# Patient Record
Sex: Female | Born: 1945
Health system: Southern US, Community
[De-identification: ages and names within clinical notes are randomized; demographics above are authoritative.]

## PROBLEM LIST (undated history)

## (undated) DIAGNOSIS — Z8719 Personal history of other diseases of the digestive system: Secondary | ICD-10-CM

## (undated) DIAGNOSIS — K219 Gastro-esophageal reflux disease without esophagitis: Secondary | ICD-10-CM

## (undated) DIAGNOSIS — M1990 Unspecified osteoarthritis, unspecified site: Secondary | ICD-10-CM

## (undated) DIAGNOSIS — R112 Nausea with vomiting, unspecified: Secondary | ICD-10-CM

## (undated) DIAGNOSIS — T8859XA Other complications of anesthesia, initial encounter: Secondary | ICD-10-CM

## (undated) DIAGNOSIS — T4145XA Adverse effect of unspecified anesthetic, initial encounter: Secondary | ICD-10-CM

## (undated) DIAGNOSIS — N189 Chronic kidney disease, unspecified: Secondary | ICD-10-CM

## (undated) DIAGNOSIS — G709 Myoneural disorder, unspecified: Secondary | ICD-10-CM

## (undated) DIAGNOSIS — Z9889 Other specified postprocedural states: Secondary | ICD-10-CM

## (undated) DIAGNOSIS — Z8679 Personal history of other diseases of the circulatory system: Secondary | ICD-10-CM

## (undated) HISTORY — PX: CHOLECYSTECTOMY: SHX55

## (undated) HISTORY — DX: Personal history of other diseases of the digestive system: Z87.19

## (undated) HISTORY — DX: Personal history of other diseases of the circulatory system: Z86.79

## (undated) HISTORY — PX: TONSILLECTOMY: SUR1361

---

## 1999-03-04 ENCOUNTER — Other Ambulatory Visit: Admission: RE | Admit: 1999-03-04 | Discharge: 1999-03-04 | Payer: Self-pay | Admitting: Obstetrics & Gynecology

## 2000-12-15 HISTORY — PX: EYE SURGERY: SHX253

## 2001-02-05 ENCOUNTER — Other Ambulatory Visit: Admission: RE | Admit: 2001-02-05 | Discharge: 2001-02-05 | Payer: Self-pay | Admitting: Obstetrics & Gynecology

## 2002-07-18 ENCOUNTER — Other Ambulatory Visit: Admission: RE | Admit: 2002-07-18 | Discharge: 2002-07-18 | Payer: Self-pay | Admitting: Obstetrics & Gynecology

## 2003-10-20 ENCOUNTER — Other Ambulatory Visit: Admission: RE | Admit: 2003-10-20 | Discharge: 2003-10-20 | Payer: Self-pay | Admitting: Gynecology

## 2004-11-18 ENCOUNTER — Other Ambulatory Visit: Admission: RE | Admit: 2004-11-18 | Discharge: 2004-11-18 | Payer: Self-pay | Admitting: Obstetrics & Gynecology

## 2005-10-16 ENCOUNTER — Ambulatory Visit: Payer: Self-pay | Admitting: Cardiology

## 2005-10-22 ENCOUNTER — Ambulatory Visit: Payer: Self-pay | Admitting: Cardiology

## 2005-10-24 ENCOUNTER — Ambulatory Visit: Payer: Self-pay | Admitting: Cardiology

## 2005-10-24 ENCOUNTER — Ambulatory Visit (HOSPITAL_COMMUNITY): Admission: RE | Admit: 2005-10-24 | Discharge: 2005-10-24 | Payer: Self-pay | Admitting: Cardiology

## 2005-11-12 ENCOUNTER — Ambulatory Visit: Payer: Self-pay | Admitting: Cardiology

## 2005-11-20 ENCOUNTER — Ambulatory Visit: Payer: Self-pay | Admitting: Cardiology

## 2005-11-21 ENCOUNTER — Other Ambulatory Visit: Admission: RE | Admit: 2005-11-21 | Discharge: 2005-11-21 | Payer: Self-pay | Admitting: Obstetrics & Gynecology

## 2014-12-12 ENCOUNTER — Other Ambulatory Visit (HOSPITAL_COMMUNITY): Payer: Self-pay | Admitting: Urology

## 2014-12-12 DIAGNOSIS — Q6211 Congenital occlusion of ureteropelvic junction: Principal | ICD-10-CM

## 2014-12-12 DIAGNOSIS — Q6239 Other obstructive defects of renal pelvis and ureter: Secondary | ICD-10-CM

## 2014-12-21 ENCOUNTER — Ambulatory Visit (HOSPITAL_COMMUNITY): Payer: Self-pay

## 2015-03-22 ENCOUNTER — Encounter (INDEPENDENT_AMBULATORY_CARE_PROVIDER_SITE_OTHER): Payer: Self-pay | Admitting: *Deleted

## 2015-03-28 ENCOUNTER — Encounter (INDEPENDENT_AMBULATORY_CARE_PROVIDER_SITE_OTHER): Payer: Self-pay | Admitting: *Deleted

## 2015-03-28 ENCOUNTER — Other Ambulatory Visit (INDEPENDENT_AMBULATORY_CARE_PROVIDER_SITE_OTHER): Payer: Self-pay | Admitting: *Deleted

## 2015-03-28 DIAGNOSIS — Z800 Family history of malignant neoplasm of digestive organs: Secondary | ICD-10-CM

## 2015-03-28 DIAGNOSIS — Z860100 Personal history of colon polyps, unspecified: Secondary | ICD-10-CM

## 2015-04-05 ENCOUNTER — Encounter (INDEPENDENT_AMBULATORY_CARE_PROVIDER_SITE_OTHER): Payer: Self-pay

## 2015-05-17 ENCOUNTER — Telehealth (INDEPENDENT_AMBULATORY_CARE_PROVIDER_SITE_OTHER): Payer: Self-pay | Admitting: *Deleted

## 2015-05-17 NOTE — Telephone Encounter (Signed)
Patient needs trilyte 

## 2015-05-18 MED ORDER — PEG 3350-KCL-NA BICARB-NACL 420 G PO SOLR: 4000.0000 mL | Freq: Once | ORAL | Status: DC

## 2015-06-07 ENCOUNTER — Telehealth (INDEPENDENT_AMBULATORY_CARE_PROVIDER_SITE_OTHER): Payer: Self-pay | Admitting: *Deleted

## 2015-06-07 NOTE — Telephone Encounter (Signed)
agree

## 2015-06-07 NOTE — Telephone Encounter (Signed)
Referring MD/PCP: vyas   Procedure: tcs  Reason/Indication:  Hx polyps, fam hx colon ca  Has patient had this procedure before?  Yes, 2011 -- scanned  If so, when, by whom and where?    Is there a family history of colon cancer?  Yes, paternal uncle & cousin  Who?  What age when diagnosed?    Is patient diabetic?   no      Does patient have prosthetic heart valve?  no  Do you have a pacemaker?  no  Has patient ever had endocarditis? no  Has patient had joint replacement within last 12 months?  no  Does patient tend to be constipated or take laxatives? no  Is patient on Coumadin, Plavix and/or Aspirin? no  Medications: allegra 180 mg daily, prilosec otc 20 mg daily, probiotic daily, vit d weekly, vit c daily, calcium 600 mg daily  Allergies: cipro, levaquin, prednisone, vesicare, allegra d  Medication Adjustment:   Procedure date & time: 06/28/15 at 1100

## 2015-06-28 ENCOUNTER — Ambulatory Visit (HOSPITAL_COMMUNITY)
Admission: RE | Admit: 2015-06-28 | Discharge: 2015-06-28 | Disposition: A | Discharge status: Home / Self Care | Payer: Medicare Other | Source: Ambulatory Visit | Attending: Internal Medicine | Admitting: Internal Medicine

## 2015-06-28 ENCOUNTER — Encounter (HOSPITAL_COMMUNITY): Payer: Self-pay | Admitting: *Deleted

## 2015-06-28 ENCOUNTER — Encounter (HOSPITAL_COMMUNITY)
Admission: RE | Disposition: A | Discharge status: Home / Self Care | Payer: Self-pay | Source: Ambulatory Visit | Attending: Internal Medicine

## 2015-06-28 DIAGNOSIS — K648 Other hemorrhoids: Secondary | ICD-10-CM

## 2015-06-28 DIAGNOSIS — Z1211 Encounter for screening for malignant neoplasm of colon: Secondary | ICD-10-CM | POA: Insufficient documentation

## 2015-06-28 DIAGNOSIS — M4690 Unspecified inflammatory spondylopathy, site unspecified: Secondary | ICD-10-CM | POA: Diagnosis not present

## 2015-06-28 DIAGNOSIS — Z86010 Personal history of colonic polyps: Secondary | ICD-10-CM | POA: Insufficient documentation

## 2015-06-28 DIAGNOSIS — K5730 Diverticulosis of large intestine without perforation or abscess without bleeding: Secondary | ICD-10-CM

## 2015-06-28 DIAGNOSIS — K219 Gastro-esophageal reflux disease without esophagitis: Secondary | ICD-10-CM | POA: Insufficient documentation

## 2015-06-28 DIAGNOSIS — Z79899 Other long term (current) drug therapy: Secondary | ICD-10-CM | POA: Diagnosis not present

## 2015-06-28 DIAGNOSIS — M13849 Other specified arthritis, unspecified hand: Secondary | ICD-10-CM | POA: Insufficient documentation

## 2015-06-28 DIAGNOSIS — Z9049 Acquired absence of other specified parts of digestive tract: Secondary | ICD-10-CM | POA: Diagnosis not present

## 2015-06-28 DIAGNOSIS — Z800 Family history of malignant neoplasm of digestive organs: Secondary | ICD-10-CM

## 2015-06-28 DIAGNOSIS — K644 Residual hemorrhoidal skin tags: Secondary | ICD-10-CM | POA: Diagnosis not present

## 2015-06-28 DIAGNOSIS — F17210 Nicotine dependence, cigarettes, uncomplicated: Secondary | ICD-10-CM | POA: Diagnosis not present

## 2015-06-28 DIAGNOSIS — N189 Chronic kidney disease, unspecified: Secondary | ICD-10-CM | POA: Insufficient documentation

## 2015-06-28 DIAGNOSIS — M1388 Other specified arthritis, other site: Secondary | ICD-10-CM | POA: Diagnosis not present

## 2015-06-28 HISTORY — DX: Nausea with vomiting, unspecified: R11.2

## 2015-06-28 HISTORY — DX: Chronic kidney disease, unspecified: N18.9

## 2015-06-28 HISTORY — DX: Gastro-esophageal reflux disease without esophagitis: K21.9

## 2015-06-28 HISTORY — DX: Unspecified osteoarthritis, unspecified site: M19.90

## 2015-06-28 HISTORY — DX: Myoneural disorder, unspecified: G70.9

## 2015-06-28 HISTORY — DX: Other complications of anesthesia, initial encounter: T88.59XA

## 2015-06-28 HISTORY — PX: COLONOSCOPY: SHX5424

## 2015-06-28 HISTORY — DX: Adverse effect of unspecified anesthetic, initial encounter: T41.45XA

## 2015-06-28 HISTORY — DX: Personal history of other diseases of the digestive system: Z87.19

## 2015-06-28 HISTORY — DX: Other specified postprocedural states: Z98.890

## 2015-06-28 SURGERY — COLONOSCOPY
Anesthesia: Moderate Sedation

## 2015-06-28 MED ORDER — DICYCLOMINE HCL 10 MG PO CAPS: 10.0000 mg | ORAL_CAPSULE | Freq: Three times a day (TID) | ORAL | Status: DC

## 2015-06-28 MED ORDER — MIDAZOLAM HCL 5 MG/5ML IJ SOLN
INTRAMUSCULAR | Status: DC | PRN
  Administered 2015-06-28 (×2): 2 mg via INTRAVENOUS
  Administered 2015-06-28 (×2): 1 mg via INTRAVENOUS

## 2015-06-28 MED ORDER — MEPERIDINE HCL 50 MG/ML IJ SOLN
INTRAMUSCULAR | Status: DC | PRN
  Administered 2015-06-28 (×2): 25 mg via INTRAVENOUS

## 2015-06-28 MED ORDER — SODIUM CHLORIDE 0.9 % IV SOLN
INTRAVENOUS | Status: DC
  Administered 2015-06-28: 1000 mL via INTRAVENOUS

## 2015-06-28 MED ORDER — STERILE WATER FOR IRRIGATION IR SOLN
Status: DC | PRN
  Administered 2015-06-28: 11:00:00

## 2015-06-28 MED ORDER — MEPERIDINE HCL 50 MG/ML IJ SOLN
INTRAMUSCULAR | Status: AC
  Filled 2015-06-28: qty 1

## 2015-06-28 MED ORDER — MIDAZOLAM HCL 5 MG/5ML IJ SOLN
INTRAMUSCULAR | Status: AC
  Filled 2015-06-28: qty 10

## 2015-06-28 NOTE — Op Note (Signed)
COLONOSCOPY PROCEDURE REPORT  PATIENT:  Suzanne Combs  MR#:  [id number] Birthdate:  02-19-1946, 69 y.o., female Endoscopist:  Dr. Malissa HippoNajeeb U. Nollan Muldrow, MD Referred By:  Dr. Ignatius Speckinghruv B. Vyas, MD Procedure Date: 06/28/2015  Procedure:   Colonoscopy  Indications:  Patient is 69 year old Caucasian female was undergoing high-risk screening colonoscopy. Family history significant for CRC and first cousin was in his 30s of the time of diagnosis and oto-metastatic disease. Paternal uncle also had colon carcinoma in early 2370s. She has chronic diarrhea felt to be due to IBS. Last colonoscopy was in 2011 with removal of 3 small polyps but these were hyperplastic.  Informed Consent:  The procedure and risks were reviewed with the patient and informed consent was obtained.  Medications:  Demerol 50 mg IV Versed 6 mg IV  Description of procedure:  After a digital rectal exam was performed, that colonoscope was advanced from the anus through the rectum and colon to the area of the cecum, ileocecal valve and appendiceal orifice. The cecum was deeply intubated. These structures were well-seen and photographed for the record. From the level of the cecum and ileocecal valve, the scope was slowly and cautiously withdrawn. The mucosal surfaces were carefully surveyed utilizing scope tip to flexion to facilitate fold flattening as needed. The scope was pulled down into the rectum where a thorough exam including retroflexion was performed. Terminal ileum was also examined.  Findings:   Prep excellent. Normal mucosa of terminal ileum. Normal mucosa of colon throughout. Scattered diverticula at descending and sigmoid colon. Normal rectal mucosa. Small hemorrhoids below the dentate line.   Therapeutic/Diagnostic Maneuvers Performed:  None  Complications:  none  Cecal Withdrawal Time:  8 minutes  Impression:  Normal mucosa of terminal ileum. Mild left-sided colonic diverticulosis. No evidence of colonic  polyps are endoscopic colitis. Small external hemorrhoids.  Recommendations:  Standard instructions given. Dicyclomine 10 mg by mouth before meals. Patient advised to keep stool diary until office visit in 6 weeks. Next colonoscopy in 5 years.  Briston Lax U  06/28/2015 12:09 PM  CC: Dr. Ignatius SpeckingVYAS,DHRUV B., MD & Dr. Bonnetta BarryNo ref. provider found

## 2015-06-28 NOTE — H&P (Signed)
Suzanne Combs is an 69 y.o. female.   Chief Complaint:    Patient is here for colonoscopy. HPI:  She 69 year old Caucasian female who is undergoing colonoscopy for screening purposes. Family history significant for CRC and first cousin was in his 30s at the time of diagnosis and died of metastatic disease. She also has a paternal uncle history of colon carcinoma in early 66s.  last colonoscopy was in December 2011 with removal of 3 small polyps but they were not adenomatous.  Patient gives history of chronic diarrhea. She denies rectal bleeding or weight loss. She has occasional nocturnal bowel movement. She did have sigmoid colon biopsy in her last examined was normal.  Past Medical History  Diagnosis Date  . Complication of anesthesia   . PONV (postoperative nausea and vomiting)   . Chronic kidney disease     Left ureter obstruction  . GERD (gastroesophageal reflux disease)   . History of hiatal hernia   . Neuromuscular disorder     bulging disc in neck  . Arthritis     back, neck, and fingers    Past Surgical History  Procedure Laterality Date  . Cholecystectomy    . Tonsillectomy    . Eye surgery Bilateral 2002    cataract    Family History  Problem Relation Age of Onset  . Stroke Mother   . Hypertension Mother   . Dementia Mother   . Arthritis Mother   . Hypertension Father   . Prostate cancer Father   . Arthritis Father   . Hypertension Sister   . Glaucoma Sister   . Diverticulosis Sister   . Hypertension Brother   . Colon cancer Paternal Uncle 35  . Colon cancer Cousin 1   Social History:  reports that she has been smoking Cigarettes.  She has a 60 pack-year smoking history. She does not have any smokeless tobacco history on file. She reports that she drinks alcohol. She reports that she does not use illicit drugs.  Allergies:  Allergies  Allergen Reactions  . Prednisone Other (See Comments)    Can not tablets(Pain,headache)  . Quinolones Other (See  Comments)    Affects joints  . Vesicare [Solifenacin]     Increased BP and heart rate    Medications Prior to Admission  Medication Sig Dispense Refill  . omeprazole (PRILOSEC OTC) 20 MG tablet Take 20 mg by mouth daily.    . Probiotic Product (PROBIOTIC DAILY PO) Take 1 capsule by mouth daily.    . vitamin C (ASCORBIC ACID) 500 MG tablet Take 500 mg by mouth daily.    . Vitamin D, Ergocalciferol, (DRISDOL) 50000 UNITS CAPS capsule Take 50,000 Units by mouth every 7 (seven) days.    . polyethylene glycol-electrolytes (NULYTELY/GOLYTELY) 420 G solution Take 4,000 mLs by mouth once. 4000 mL 0    No results found for this or any previous visit (from the past 48 hour(s)). No results found.  ROS  Blood pressure 134/81, pulse 73, temperature 98.7 F (37.1 C), temperature source Oral, resp. rate 16, height  (1.651 m), weight 165 lb (74.844 kg), SpO2 99 %. Physical Exam  Constitutional: She appears well-developed and well-nourished.  HENT:  Mouth/Throat: Oropharynx is clear and moist.  Eyes: Conjunctivae are normal. No scleral icterus.  Neck: No thyromegaly present.  Cardiovascular: Normal rate, regular rhythm and normal heart sounds.   No murmur heard. Respiratory: Effort normal and breath sounds normal.  GI: Soft. She exhibits no distension. There is tenderness (  mild generalized tenderness.).  Musculoskeletal: She exhibits no edema.  Lymphadenopathy:    She has no cervical adenopathy.  Neurological: She is alert.  Skin: Skin is warm and dry.     Assessment/Plan  High risk screening colonoscopy.  Chronic diarrhea felt to be IBS.  Bricen Victory U 06/28/2015, 11:32 AM

## 2015-06-28 NOTE — Discharge Instructions (Signed)
Resume usual medications and high fiber diet. Dicyclomine 10 mg by mouth 30 minutes before each meal. No driving for 24 hours. Keep stool diary as to frequency and consistency of stools until office visit in 6 weeks.  Colonoscopy, Care After Refer to this sheet in the next few weeks. These instructions provide you with information on caring for yourself after your procedure. Your health care provider may also give you more specific instructions. Your treatment has been planned according to current medical practices, but problems sometimes occur. Call your health care provider if you have any problems or questions after your procedure. WHAT TO EXPECT AFTER THE PROCEDURE  After your procedure, it is typical to have the following:  A small amount of blood in your stool.  Moderate amounts of gas and mild abdominal cramping or bloating. HOME CARE INSTRUCTIONS  Do not drive, operate machinery, or sign important documents for 24 hours.  You may shower and resume your regular physical activities, but move at a slower pace for the first 24 hours.  Take frequent rest periods for the first 24 hours.  Walk around or put a warm pack on your abdomen to help reduce abdominal cramping and bloating.  Drink enough fluids to keep your urine clear or pale yellow.  You may resume your normal diet as instructed by your health care provider. Avoid heavy or fried foods that are hard to digest.  Avoid drinking alcohol for 24 hours or as instructed by your health care provider.  Only take over-the-counter or prescription medicines as directed by your health care provider.  If a tissue sample (biopsy) was taken during your procedure:  Do not take aspirin or blood thinners for 7 days, or as instructed by your health care provider.  Do not drink alcohol for 7 days, or as instructed by your health care provider.  Eat soft foods for the first 24 hours. SEEK MEDICAL CARE IF: You have persistent spotting of  blood in your stool 2-3 days after the procedure. SEEK IMMEDIATE MEDICAL CARE IF:  You have more than a small spotting of blood in your stool.  You pass large blood clots in your stool.  Your abdomen is swollen (distended).  You have nausea or vomiting.  You have a fever.  You have increasing abdominal pain that is not relieved with medicine. Document Released: 07/15/2004 Document Revised: 09/21/2013 Document Reviewed: 08/08/2013 [facility] Patient Information 2015 [place], [place]. This information is not intended to replace advice given to you by your health care provider. Make sure you discuss any questions you have with your health care provider.  Diverticulosis Diverticulosis is the condition that develops when small pouches (diverticula) form in the wall of your colon. Your colon, or large intestine, is where water is absorbed and stool is formed. The pouches form when the inside layer of your colon pushes through weak spots in the outer layers of your colon. CAUSES  No one knows exactly what causes diverticulosis. RISK FACTORS  Being older than 50. Your risk for this condition increases with age. Diverticulosis is rare in people younger than 40 years. By age 69, almost everyone has it.  Eating a low-fiber diet.  Being frequently constipated.  Being overweight.  Not getting enough exercise.  Smoking.  Taking over-the-counter pain medicines, like aspirin and ibuprofen. SYMPTOMS  Most people with diverticulosis do not have symptoms. DIAGNOSIS  Because diverticulosis often has no symptoms, health care providers often discover the condition during an exam for other colon problems. In  many cases, a health care provider will diagnose diverticulosis while using a flexible scope to examine the colon (colonoscopy). TREATMENT  If you have never developed an infection related to diverticulosis, you may not need treatment. If you have had an infection before, treatment may  include:  Eating more fruits, vegetables, and grains.  Taking a fiber supplement.  Taking a live bacteria supplement (probiotic).  Taking medicine to relax your colon. HOME CARE INSTRUCTIONS   Drink at least 6-8 glasses of water each day to prevent constipation.  Try not to strain when you have a bowel movement.  Keep all follow-up appointments. If you have had an infection before:  Increase the fiber in your diet as directed by your health care provider or dietitian.  Take a dietary fiber supplement if your health care provider approves.  Only take medicines as directed by your health care provider. SEEK MEDICAL CARE IF:   You have abdominal pain.  You have bloating.  You have cramps.  You have not gone to the bathroom in 3 days. SEEK IMMEDIATE MEDICAL CARE IF:   Your pain gets worse.  Yourbloating becomes very bad.  You have a fever or chills, and your symptoms suddenly get worse.  You begin vomiting.  You have bowel movements that are bloody or black. MAKE SURE YOU:  Understand these instructions.  Will watch your condition.  Will get help right away if you are not doing well or get worse. Document Released: 08/28/2004 Document Revised: 12/06/2013 Document Reviewed: 10/26/2013 [facility] Patient Information 2015 [place], [place]. This information is not intended to replace advice given to you by your health care provider. Make sure you discuss any questions you have with your health care provider.  High-Fiber Diet Fiber is found in fruits, vegetables, and grains. A high-fiber diet encourages the addition of more whole grains, legumes, fruits, and vegetables in your diet. The recommended amount of fiber for adult males is 38 g per day. For adult females, it is 25 g per day. Pregnant and lactating women should get 28 g of fiber per day. If you have a digestive or bowel problem, ask your caregiver for advice before adding high-fiber foods to your diet. Eat a  variety of high-fiber foods instead of only a select few type of foods.  PURPOSE  To increase stool bulk.  To make bowel movements more regular to prevent constipation.  To lower cholesterol.  To prevent overeating. WHEN IS THIS DIET USED?  It may be used if you have constipation and hemorrhoids.  It may be used if you have uncomplicated diverticulosis (intestine condition) and irritable bowel syndrome.  It may be used if you need help with weight management.  It may be used if you want to add it to your diet as a protective measure against atherosclerosis, diabetes, and cancer. SOURCES OF FIBER  Whole-grain breads and cereals.  Fruits, such as apples, oranges, bananas, berries, prunes, and pears.  Vegetables, such as green peas, carrots, sweet potatoes, beets, broccoli, cabbage, spinach, and artichokes.  Legumes, such split peas, soy, lentils.  Almonds. FIBER CONTENT IN FOODS Starches and Grains / Dietary Fiber (g)  Cheerios, 1 cup / 3 g  Corn Flakes cereal, 1 cup / 0.7 g  Rice crispy treat cereal, 1 cup / 0.3 g  Instant oatmeal (cooked),  cup / 2 g  Frosted wheat cereal, 1 cup / 5.1 g  Brown, long-grain rice (cooked), 1 cup / 3.5 g  White, long-grain rice (cooked), 1 cup /  0.6 g  Enriched macaroni (cooked), 1 cup / 2.5 g Legumes / Dietary Fiber (g)  Baked beans (canned, plain, or vegetarian),  cup / 5.2 g  Kidney beans (canned),  cup / 6.8 g  Pinto beans (cooked),  cup / 5.5 g Breads and Crackers / Dietary Fiber (g)  Plain or honey graham crackers, 2 squares / 0.7 g  Saltine crackers, 3 squares / 0.3 g  Plain, salted pretzels, 10 pieces / 1.8 g  Whole-wheat bread, 1 slice / 1.9 g  White bread, 1 slice / 0.7 g  Raisin bread, 1 slice / 1.2 g  Plain bagel, 3 oz / 2 g  Flour tortilla, 1 oz / 0.9 g  Corn tortilla, 1 small / 1.5 g  Hamburger or hotdog bun, 1 small / 0.9 g Fruits / Dietary Fiber (g)  Apple with skin, 1 medium / 4.4  g  Sweetened applesauce,  cup / 1.5 g  Banana,  medium / 1.5 g  Grapes, 10 grapes / 0.4 g  Orange, 1 small / 2.3 g  Raisin, 1.5 oz / 1.6 g  Melon, 1 cup / 1.4 g Vegetables / Dietary Fiber (g)  Green beans (canned),  cup / 1.3 g  Carrots (cooked),  cup / 2.3 g  Broccoli (cooked),  cup / 2.8 g  Peas (cooked),  cup / 4.4 g  Mashed potatoes,  cup / 1.6 g  Lettuce, 1 cup / 0.5 g  Corn (canned),  cup / 1.6 g  Tomato,  cup / 1.1 g Document Released: 12/01/2005 Document Revised: 06/01/2012 Document Reviewed: 03/04/2012 [employer] Patient Information 2015 [place], [place]. This information is not intended to replace advice given to you by your health care provider. Make sure you discuss any questions you have with your health care provider.

## 2015-06-29 ENCOUNTER — Telehealth (INDEPENDENT_AMBULATORY_CARE_PROVIDER_SITE_OTHER): Payer: Self-pay | Admitting: *Deleted

## 2015-06-29 NOTE — Telephone Encounter (Signed)
Per OP note, patient need OV in 6 weeks

## 2015-07-02 ENCOUNTER — Encounter (HOSPITAL_COMMUNITY): Payer: Self-pay | Admitting: Internal Medicine

## 2015-07-16 ENCOUNTER — Telehealth (INDEPENDENT_AMBULATORY_CARE_PROVIDER_SITE_OTHER): Payer: Self-pay | Admitting: *Deleted

## 2015-07-16 NOTE — Telephone Encounter (Signed)
Per Dr.Rehman the patient should stop the medication. She may take Imodium 2 mg - 1 by mouth every morning.  She should also make sure that she is taking 4 grams of fiber daily.  Patient was called and made aware.

## 2015-07-16 NOTE — Telephone Encounter (Signed)
Suzanne Combs said the Dicyclomine is given her headaches, dried her mouth out so bad that it felt like it was going to break and real nervous feeling. She stopped the medicine on 07/13/15 and now the diarrhea is back. Her return phone number is [phone].

## 2015-07-16 NOTE — Telephone Encounter (Signed)
Apt has been scheduled for 08/06/15 with Dorene Ar, NP.

## 2015-08-06 ENCOUNTER — Encounter (INDEPENDENT_AMBULATORY_CARE_PROVIDER_SITE_OTHER): Payer: Self-pay | Admitting: Internal Medicine

## 2015-08-06 ENCOUNTER — Ambulatory Visit (INDEPENDENT_AMBULATORY_CARE_PROVIDER_SITE_OTHER): Payer: Medicare Other | Admitting: Internal Medicine

## 2015-08-06 VITALS — BP 124/70 | HR 64 | Temp 98.6°F | Ht 65.0 in | Wt 165.1 lb

## 2015-08-06 DIAGNOSIS — K589 Irritable bowel syndrome without diarrhea: Secondary | ICD-10-CM

## 2015-08-06 NOTE — Patient Instructions (Addendum)
Fiber 4 gm daily. Fiber bar. Benefiber

## 2015-08-06 NOTE — Progress Notes (Signed)
   Subjective:    Patient ID: Suzanne Combs, female    DOB: 05-03-1946, 69 y.o.   MRN: [id number]  HPI Here today for f/u. Recently underwent a colonoscopy in July for family hx of colon cancer. She tells me her diarrhea is somewhat better. She still has some diarrhea. If she has one BM that is normal, the next one will be normal. She tried Imodium but she became constipated. Dicyclomine made her mouth dry so she stopped. She usually has a BM 3-4 times a day.  Appetite is good. No weight loss.     06/28/2015: Colonoscopy  Indications: Patient is 69 year old Caucasian female was undergoing high-risk screening colonoscopy. Family history significant for CRC and first cousin was in his 30s of the time of diagnosis and oto-metastatic disease. Paternal uncle also had colon carcinoma in early 57s. She has chronic diarrhea felt to be due to IBS. Last colonoscopy was in 2011 with removal of 3 small polyps but these were hyperplastic.  Impression:  Normal mucosa of terminal ileum. Mild left-sided colonic diverticulosis. No evidence of colonic polyps are endoscopic colitis. Small external hemorrhoids.     Review of Systems Past Medical History  Diagnosis Date  . Complication of anesthesia   . PONV (postoperative nausea and vomiting)   . Chronic kidney disease     Left ureter obstruction  . GERD (gastroesophageal reflux disease)   . History of hiatal hernia   . Neuromuscular disorder     bulging disc in neck  . Arthritis     back, neck, and fingers    Past Surgical History  Procedure Laterality Date  . Cholecystectomy    . Tonsillectomy    . Eye surgery Bilateral 2002    cataract  . Colonoscopy N/A 06/28/2015    Procedure: COLONOSCOPY;  Surgeon: Malissa Hippo, MD;  Location: [facility];  Service: Endoscopy;  Laterality: N/A;  1100    Allergies  Allergen Reactions  . Dicyclomine Other (See Comments)    Severe dry mouth  . Prednisone Other (See Comments)    Can not  tablets(Pain,headache)  . Quinolones Other (See Comments)    Affects joints  . Vesicare [Solifenacin]     Increased BP and heart rate    Current Outpatient Prescriptions on File Prior to Visit  Medication Sig Dispense Refill  . Probiotic Product (PROBIOTIC DAILY PO) Take 1 capsule by mouth daily.     No current facility-administered medications on file prior to visit.        Objective:   Physical Exam Filed Vitals:   08/06/15 1027  Height:  (1.651 m)  Weight: 165 lb 1.6 oz (74.889 kg)    Alert and oriented. Skin warm and dry. Oral mucosa is moist.   . Sclera anicteric, conjunctivae is pink. Thyroid not enlarged. No cervical lymphadenopathy. Lungs clear. Heart regular rate and rhythm.  Abdomen is soft. Bowel sounds are positive. No hepatomegaly. No abdominal masses felt. No tenderness.  No edema to lower extremities.        Assessment & Plan:  Family hx of colon cancer.  Next colonoscopy in 5 yrs. IBS: Fiber 4 gm, Fiber bar, or Fibercon. OV in1 year.

## 2015-12-18 DIAGNOSIS — Q6211 Congenital occlusion of ureteropelvic junction: Secondary | ICD-10-CM | POA: Diagnosis not present

## 2016-01-21 DIAGNOSIS — J069 Acute upper respiratory infection, unspecified: Secondary | ICD-10-CM | POA: Diagnosis not present

## 2016-02-13 DIAGNOSIS — R6889 Other general symptoms and signs: Secondary | ICD-10-CM | POA: Diagnosis not present

## 2016-02-13 DIAGNOSIS — J069 Acute upper respiratory infection, unspecified: Secondary | ICD-10-CM | POA: Diagnosis not present

## 2016-02-13 DIAGNOSIS — Z720 Tobacco use: Secondary | ICD-10-CM | POA: Diagnosis not present

## 2016-02-13 DIAGNOSIS — Z299 Encounter for prophylactic measures, unspecified: Secondary | ICD-10-CM | POA: Diagnosis not present

## 2016-03-13 DIAGNOSIS — J209 Acute bronchitis, unspecified: Secondary | ICD-10-CM | POA: Diagnosis not present

## 2016-03-13 DIAGNOSIS — R05 Cough: Secondary | ICD-10-CM | POA: Diagnosis not present

## 2016-03-13 DIAGNOSIS — F17210 Nicotine dependence, cigarettes, uncomplicated: Secondary | ICD-10-CM | POA: Diagnosis not present

## 2016-03-13 DIAGNOSIS — Z720 Tobacco use: Secondary | ICD-10-CM | POA: Diagnosis not present

## 2016-03-14 DIAGNOSIS — J984 Other disorders of lung: Secondary | ICD-10-CM | POA: Diagnosis not present

## 2016-03-14 DIAGNOSIS — R05 Cough: Secondary | ICD-10-CM | POA: Diagnosis not present

## 2016-03-20 DIAGNOSIS — R102 Pelvic and perineal pain: Secondary | ICD-10-CM | POA: Diagnosis not present

## 2016-03-25 DIAGNOSIS — R5383 Other fatigue: Secondary | ICD-10-CM | POA: Diagnosis not present

## 2016-03-25 DIAGNOSIS — Z1211 Encounter for screening for malignant neoplasm of colon: Secondary | ICD-10-CM | POA: Diagnosis not present

## 2016-03-25 DIAGNOSIS — Z6826 Body mass index (BMI) 26.0-26.9, adult: Secondary | ICD-10-CM | POA: Diagnosis not present

## 2016-03-25 DIAGNOSIS — Z79899 Other long term (current) drug therapy: Secondary | ICD-10-CM | POA: Diagnosis not present

## 2016-03-25 DIAGNOSIS — E559 Vitamin D deficiency, unspecified: Secondary | ICD-10-CM | POA: Diagnosis not present

## 2016-03-25 DIAGNOSIS — Z0000 Encounter for general adult medical examination without abnormal findings: Secondary | ICD-10-CM | POA: Diagnosis not present

## 2016-03-25 DIAGNOSIS — Z1389 Encounter for screening for other disorder: Secondary | ICD-10-CM | POA: Diagnosis not present

## 2016-03-25 DIAGNOSIS — Z299 Encounter for prophylactic measures, unspecified: Secondary | ICD-10-CM | POA: Diagnosis not present

## 2016-04-07 DIAGNOSIS — Z1231 Encounter for screening mammogram for malignant neoplasm of breast: Secondary | ICD-10-CM | POA: Diagnosis not present

## 2016-04-18 DIAGNOSIS — R05 Cough: Secondary | ICD-10-CM | POA: Diagnosis not present

## 2016-04-24 ENCOUNTER — Encounter (INDEPENDENT_AMBULATORY_CARE_PROVIDER_SITE_OTHER): Payer: Self-pay | Admitting: Internal Medicine

## 2016-04-25 DIAGNOSIS — J449 Chronic obstructive pulmonary disease, unspecified: Secondary | ICD-10-CM | POA: Diagnosis not present

## 2016-04-25 DIAGNOSIS — Z720 Tobacco use: Secondary | ICD-10-CM | POA: Diagnosis not present

## 2016-04-25 DIAGNOSIS — F17210 Nicotine dependence, cigarettes, uncomplicated: Secondary | ICD-10-CM | POA: Diagnosis not present

## 2016-04-25 DIAGNOSIS — Z299 Encounter for prophylactic measures, unspecified: Secondary | ICD-10-CM | POA: Diagnosis not present

## 2016-05-29 DIAGNOSIS — H40053 Ocular hypertension, bilateral: Secondary | ICD-10-CM | POA: Diagnosis not present

## 2016-06-20 DIAGNOSIS — N393 Stress incontinence (female) (male): Secondary | ICD-10-CM | POA: Diagnosis not present

## 2016-06-20 DIAGNOSIS — R338 Other retention of urine: Secondary | ICD-10-CM | POA: Diagnosis not present

## 2016-06-20 DIAGNOSIS — Q6211 Congenital occlusion of ureteropelvic junction: Secondary | ICD-10-CM | POA: Diagnosis not present

## 2016-06-23 DIAGNOSIS — Z299 Encounter for prophylactic measures, unspecified: Secondary | ICD-10-CM | POA: Diagnosis not present

## 2016-06-23 DIAGNOSIS — M26629 Arthralgia of temporomandibular joint, unspecified side: Secondary | ICD-10-CM | POA: Diagnosis not present

## 2016-06-23 DIAGNOSIS — J449 Chronic obstructive pulmonary disease, unspecified: Secondary | ICD-10-CM | POA: Diagnosis not present

## 2016-06-23 DIAGNOSIS — Z720 Tobacco use: Secondary | ICD-10-CM | POA: Diagnosis not present

## 2016-07-01 DIAGNOSIS — N393 Stress incontinence (female) (male): Secondary | ICD-10-CM | POA: Diagnosis not present

## 2016-07-01 DIAGNOSIS — R278 Other lack of coordination: Secondary | ICD-10-CM | POA: Diagnosis not present

## 2016-07-01 DIAGNOSIS — M62838 Other muscle spasm: Secondary | ICD-10-CM | POA: Diagnosis not present

## 2016-07-01 DIAGNOSIS — R152 Fecal urgency: Secondary | ICD-10-CM | POA: Diagnosis not present

## 2016-07-01 DIAGNOSIS — R3982 Chronic bladder pain: Secondary | ICD-10-CM | POA: Diagnosis not present

## 2016-07-08 DIAGNOSIS — R3982 Chronic bladder pain: Secondary | ICD-10-CM | POA: Diagnosis not present

## 2016-07-08 DIAGNOSIS — R278 Other lack of coordination: Secondary | ICD-10-CM | POA: Diagnosis not present

## 2016-07-08 DIAGNOSIS — N393 Stress incontinence (female) (male): Secondary | ICD-10-CM | POA: Diagnosis not present

## 2016-07-08 DIAGNOSIS — M62838 Other muscle spasm: Secondary | ICD-10-CM | POA: Diagnosis not present

## 2016-07-17 ENCOUNTER — Ambulatory Visit (INDEPENDENT_AMBULATORY_CARE_PROVIDER_SITE_OTHER): Payer: Medicare Other | Admitting: Internal Medicine

## 2016-07-22 DIAGNOSIS — M62838 Other muscle spasm: Secondary | ICD-10-CM | POA: Diagnosis not present

## 2016-07-22 DIAGNOSIS — R339 Retention of urine, unspecified: Secondary | ICD-10-CM | POA: Diagnosis not present

## 2016-07-22 DIAGNOSIS — R278 Other lack of coordination: Secondary | ICD-10-CM | POA: Diagnosis not present

## 2016-07-22 DIAGNOSIS — R102 Pelvic and perineal pain: Secondary | ICD-10-CM | POA: Diagnosis not present

## 2016-07-22 DIAGNOSIS — N393 Stress incontinence (female) (male): Secondary | ICD-10-CM | POA: Diagnosis not present

## 2016-08-12 DIAGNOSIS — N393 Stress incontinence (female) (male): Secondary | ICD-10-CM | POA: Diagnosis not present

## 2016-08-12 DIAGNOSIS — R102 Pelvic and perineal pain: Secondary | ICD-10-CM | POA: Diagnosis not present

## 2016-08-12 DIAGNOSIS — R152 Fecal urgency: Secondary | ICD-10-CM | POA: Diagnosis not present

## 2016-08-12 DIAGNOSIS — M62838 Other muscle spasm: Secondary | ICD-10-CM | POA: Diagnosis not present

## 2016-08-12 DIAGNOSIS — M6281 Muscle weakness (generalized): Secondary | ICD-10-CM | POA: Diagnosis not present

## 2016-08-12 DIAGNOSIS — R278 Other lack of coordination: Secondary | ICD-10-CM | POA: Diagnosis not present

## 2016-08-19 ENCOUNTER — Ambulatory Visit (INDEPENDENT_AMBULATORY_CARE_PROVIDER_SITE_OTHER): Payer: Medicare Other | Admitting: Internal Medicine

## 2016-08-19 ENCOUNTER — Encounter (INDEPENDENT_AMBULATORY_CARE_PROVIDER_SITE_OTHER): Payer: Self-pay | Admitting: Internal Medicine

## 2016-08-19 VITALS — BP 138/72 | Temp 98.1°F | Ht 65.5 in | Wt 169.0 lb

## 2016-08-19 DIAGNOSIS — K589 Irritable bowel syndrome without diarrhea: Secondary | ICD-10-CM | POA: Diagnosis not present

## 2016-08-19 MED ORDER — HYOSCYAMINE SULFATE 0.125 MG SL SUBL: 0.1250 mg | SUBLINGUAL_TABLET | Freq: Four times a day (QID) | SUBLINGUAL | 1 refills | Status: DC | PRN

## 2016-08-19 MED ORDER — RIFAXIMIN 550 MG PO TABS: 550.0000 mg | ORAL_TABLET | Freq: Three times a day (TID) | ORAL | 0 refills | Status: DC

## 2016-08-19 NOTE — Progress Notes (Signed)
Presenting complaint;  Diarrhea and abdominal cramping.  Database and Subjective:  Patient is 70 year old Caucasian female who has been having problems with diarrhea and abdominal cramping for about 6 years. She recalls that her bowels for regular like clockwork until she developed acute diarrhea when she had as many as 18 to 20 stools per day. [place] Stool studies reportedly were negative. She underwent colonoscopy in December 2011 and there was no evidence of endoscopic colitis. Biopsy from ascending colon was negative for microscopic or collagenous colitis. She had three small polyps removed and these were non-adenomatous.  She returned for high risk screening colonoscopy in July 2016. This exam revealed normal mucosa of terminal ileum normal colonic mucosa scattered diverticula at descending and sigmoid colon and small external hemorrhoids. Patient was given prescription for dicyclomine which caused headache and medication was stopped.   Current Medications: Outpatient Encounter Prescriptions as of 08/19/2016  Medication Sig  . Probiotic Product (PROBIOTIC DAILY PO) Take 1 capsule by mouth daily.   No facility-administered encounter medications on file as of 08/19/2016.      Objective: Blood pressure 138/72, temperature 98.1 F (36.7 C), temperature source Oral, height 5' 5.5" (1.664 m), weight 169 lb (76.7 kg). Patient is alert and in no acute distress. Conjunctiva is pink. Sclera is nonicteric Oropharyngeal mucosa is normal. No neck masses or thyromegaly noted. Cardiac exam with regular rhythm normal S1 and S2. No murmur or gallop noted. Lungs are clear to auscultation. Abdomen is full. Bowel sounds are normal. On palpation abdomen is soft with mild tenderness in right low quadrant without guarding and no organomegaly or masses. No LE edema or clubbing noted.   Assessment:  #1. Irritable bowel syndrome. She possibly has postinfectious IBS  which started about 6 years ago when she had copious diarrhea followed by chronic symptoms. None biopsy in December 2011 was negative for microscopic or collagenous colitis and colonoscopy was unremarkable in July 2016. She could not tolerate dicyclomine. While her symptoms are acting quality of her life she does not have alarm symptoms. If she does not respond to therapy she will need further evaluation.  Plan:  Hyoscyamine sublingual 1 tablet 4 times a day when necessary. Xifaxan 550 mg by mouth 3 times a day for 2 weeks. Patient advised to keep symptom diary as to frequency and consistency of stools as well as frequency of abdominal pain or cramping until office visit in 4 weeks.

## 2016-08-19 NOTE — Patient Instructions (Addendum)
Keep symptom diary as to frequency and consistency of stools and when you have abdominal cramping.

## 2016-08-26 DIAGNOSIS — M6281 Muscle weakness (generalized): Secondary | ICD-10-CM | POA: Diagnosis not present

## 2016-08-26 DIAGNOSIS — M62838 Other muscle spasm: Secondary | ICD-10-CM | POA: Diagnosis not present

## 2016-08-26 DIAGNOSIS — N393 Stress incontinence (female) (male): Secondary | ICD-10-CM | POA: Diagnosis not present

## 2016-08-26 DIAGNOSIS — R152 Fecal urgency: Secondary | ICD-10-CM | POA: Diagnosis not present

## 2016-08-26 DIAGNOSIS — R102 Pelvic and perineal pain: Secondary | ICD-10-CM | POA: Diagnosis not present

## 2016-08-26 DIAGNOSIS — R278 Other lack of coordination: Secondary | ICD-10-CM | POA: Diagnosis not present

## 2016-09-02 DIAGNOSIS — M62838 Other muscle spasm: Secondary | ICD-10-CM | POA: Diagnosis not present

## 2016-09-02 DIAGNOSIS — R278 Other lack of coordination: Secondary | ICD-10-CM | POA: Diagnosis not present

## 2016-09-02 DIAGNOSIS — R152 Fecal urgency: Secondary | ICD-10-CM | POA: Diagnosis not present

## 2016-09-02 DIAGNOSIS — N393 Stress incontinence (female) (male): Secondary | ICD-10-CM | POA: Diagnosis not present

## 2016-09-02 DIAGNOSIS — M6281 Muscle weakness (generalized): Secondary | ICD-10-CM | POA: Diagnosis not present

## 2016-09-03 ENCOUNTER — Other Ambulatory Visit (INDEPENDENT_AMBULATORY_CARE_PROVIDER_SITE_OTHER): Payer: Self-pay | Admitting: *Deleted

## 2016-09-15 DIAGNOSIS — F17210 Nicotine dependence, cigarettes, uncomplicated: Secondary | ICD-10-CM | POA: Diagnosis not present

## 2016-09-15 DIAGNOSIS — E042 Nontoxic multinodular goiter: Secondary | ICD-10-CM | POA: Diagnosis not present

## 2016-09-16 ENCOUNTER — Ambulatory Visit (INDEPENDENT_AMBULATORY_CARE_PROVIDER_SITE_OTHER): Payer: Medicare Other | Admitting: Internal Medicine

## 2016-09-25 DIAGNOSIS — M6281 Muscle weakness (generalized): Secondary | ICD-10-CM | POA: Diagnosis not present

## 2016-09-25 DIAGNOSIS — M62838 Other muscle spasm: Secondary | ICD-10-CM | POA: Diagnosis not present

## 2016-09-25 DIAGNOSIS — R152 Fecal urgency: Secondary | ICD-10-CM | POA: Diagnosis not present

## 2016-09-25 DIAGNOSIS — N393 Stress incontinence (female) (male): Secondary | ICD-10-CM | POA: Diagnosis not present

## 2016-09-25 DIAGNOSIS — R278 Other lack of coordination: Secondary | ICD-10-CM | POA: Diagnosis not present

## 2016-09-26 DIAGNOSIS — F17200 Nicotine dependence, unspecified, uncomplicated: Secondary | ICD-10-CM | POA: Diagnosis not present

## 2016-09-26 DIAGNOSIS — Z720 Tobacco use: Secondary | ICD-10-CM | POA: Diagnosis not present

## 2016-09-26 DIAGNOSIS — K589 Irritable bowel syndrome without diarrhea: Secondary | ICD-10-CM | POA: Diagnosis not present

## 2016-09-26 DIAGNOSIS — B370 Candidal stomatitis: Secondary | ICD-10-CM | POA: Diagnosis not present

## 2016-09-26 DIAGNOSIS — Z6827 Body mass index (BMI) 27.0-27.9, adult: Secondary | ICD-10-CM | POA: Diagnosis not present

## 2016-10-07 DIAGNOSIS — M62838 Other muscle spasm: Secondary | ICD-10-CM | POA: Diagnosis not present

## 2016-10-07 DIAGNOSIS — R278 Other lack of coordination: Secondary | ICD-10-CM | POA: Diagnosis not present

## 2016-10-07 DIAGNOSIS — R152 Fecal urgency: Secondary | ICD-10-CM | POA: Diagnosis not present

## 2016-10-07 DIAGNOSIS — M6281 Muscle weakness (generalized): Secondary | ICD-10-CM | POA: Diagnosis not present

## 2016-10-07 DIAGNOSIS — N393 Stress incontinence (female) (male): Secondary | ICD-10-CM | POA: Diagnosis not present

## 2016-10-07 DIAGNOSIS — R102 Pelvic and perineal pain: Secondary | ICD-10-CM | POA: Diagnosis not present

## 2016-10-09 ENCOUNTER — Encounter (INDEPENDENT_AMBULATORY_CARE_PROVIDER_SITE_OTHER): Payer: Self-pay

## 2016-10-20 ENCOUNTER — Other Ambulatory Visit (INDEPENDENT_AMBULATORY_CARE_PROVIDER_SITE_OTHER): Payer: Self-pay | Admitting: Internal Medicine

## 2016-10-21 DIAGNOSIS — M62838 Other muscle spasm: Secondary | ICD-10-CM | POA: Diagnosis not present

## 2016-10-21 DIAGNOSIS — M6281 Muscle weakness (generalized): Secondary | ICD-10-CM | POA: Diagnosis not present

## 2016-10-21 DIAGNOSIS — R278 Other lack of coordination: Secondary | ICD-10-CM | POA: Diagnosis not present

## 2016-10-21 DIAGNOSIS — N393 Stress incontinence (female) (male): Secondary | ICD-10-CM | POA: Diagnosis not present

## 2016-10-21 DIAGNOSIS — R152 Fecal urgency: Secondary | ICD-10-CM | POA: Diagnosis not present

## 2016-10-21 DIAGNOSIS — R102 Pelvic and perineal pain: Secondary | ICD-10-CM | POA: Diagnosis not present

## 2016-11-03 DIAGNOSIS — Z713 Dietary counseling and surveillance: Secondary | ICD-10-CM | POA: Diagnosis not present

## 2016-11-03 DIAGNOSIS — Z299 Encounter for prophylactic measures, unspecified: Secondary | ICD-10-CM | POA: Diagnosis not present

## 2016-11-03 DIAGNOSIS — Z6827 Body mass index (BMI) 27.0-27.9, adult: Secondary | ICD-10-CM | POA: Diagnosis not present

## 2016-11-03 DIAGNOSIS — J069 Acute upper respiratory infection, unspecified: Secondary | ICD-10-CM | POA: Diagnosis not present

## 2016-11-03 DIAGNOSIS — Z720 Tobacco use: Secondary | ICD-10-CM | POA: Diagnosis not present

## 2016-11-04 ENCOUNTER — Encounter (INDEPENDENT_AMBULATORY_CARE_PROVIDER_SITE_OTHER): Payer: Self-pay | Admitting: Internal Medicine

## 2016-11-04 ENCOUNTER — Ambulatory Visit (INDEPENDENT_AMBULATORY_CARE_PROVIDER_SITE_OTHER): Payer: Medicare Other | Admitting: Internal Medicine

## 2016-11-04 VITALS — BP 132/86 | HR 74 | Temp 97.2°F | Ht 65.0 in | Wt 168.0 lb

## 2016-11-04 DIAGNOSIS — K580 Irritable bowel syndrome with diarrhea: Secondary | ICD-10-CM

## 2016-11-04 MED ORDER — LOPERAMIDE HCL 2 MG PO TABS: 1.0000 mg | ORAL_TABLET | Freq: Every day | ORAL | 0 refills | Status: DC

## 2016-11-04 NOTE — Progress Notes (Signed)
Presenting complaint;  Follow-up for diarrhea/IBS.  Subjective:  Patient is 70 year old Caucasian female who is here for scheduled visit. She was last seen on 08/19/2016 for diarrhea and abdominal cramping. Symptoms started in 2011 when she had acute diarrhea with as many as he stools per day. Stool studies and colonoscopy in December 2011 were negative.  She had high risk screening colonoscopy in July 2016 revealing scattered left-sided diverticulosis and external hemorrhoids. She was given dicyclomine but developed headache. On her last visit she was given prescription for Xifaxan as well as hyoscyamine sublingual. She noted significant improvement with Xifaxan and called for refill. She states she is feeling much better. She is having 2-4 stools per day. More than 50% of her stools are formed. She has not had any accidents since her last visit but she did have a single episode of diarrhea at night. She is having less abdominal cramping. She remains with good appetite and her weight has been stable. She has tried Imodium OTC but it makes her constipated. She generally takes 2 mg a day. Patient states she was begun on Bactrim for sinusitis yesterday.   Current Medications: Outpatient Encounter Prescriptions as of 11/04/2016  Medication Sig  . Levocetirizine Dihydrochloride (XYZAL PO) Take by mouth.  . sulfamethoxazole-trimethoprim (BACTRIM DS,SEPTRA DS) 800-160 MG tablet   . XIFAXAN 550 MG TABS tablet TAKE 1 TABLET BY MOUTH THREE TIMES DAILY  . hyoscyamine (LEVSIN SL) 0.125 MG SL tablet Place 1 tablet (0.125 mg total) under the tongue every 6 (six) hours as needed. (Patient not taking: Reported on 11/04/2016)  . nystatin (MYCOSTATIN) 100000 UNIT/ML suspension   . Probiotic Product (PROBIOTIC DAILY PO) Take 1 capsule by mouth daily.   No facility-administered encounter medications on file as of 11/04/2016.      Objective: Blood pressure 132/86, pulse 74, temperature 97.2 F (36.2 C),  height 5\' 5"  (1.651 m), weight 168 lb (76.2 kg). Patient is alert and in no acute distress. Conjunctiva is pink. Sclera is nonicteric Oropharyngeal mucosa is normal. No neck masses or thyromegaly noted. Cardiac exam with regular rhythm normal S1 and S2. No murmur or gallop noted. Lungs are clear to auscultation. Abdomen is full. Bowel sounds are normal. On palpation abdomen is soft though tenderness organomegaly or masses.  No LE edema or clubbing noted.   Assessment:  #1.IBS-D. 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 She is doing better with Xifaxan but had to be retreated. Now she is on Bactrim for sinusitis which may complicate her management. She will need to be off Xifaxan while on Bactrim. Will need to monitor her symptoms closely to make sure she does not develop C. difficile colitis with Bactrim.   Plan:  Patient advised to hold Xifaxan while on Bactrim. Patient will resume Xifaxan when she finishes Bactrim. Imodium OTC 1 mg by mouth every morning. Continue probiotic daily. Hyoscyamine sublingual 1 dose before lunch and evening meal daily. Keep stool diary for the next 4 weeks and call with progress report

## 2016-11-04 NOTE — Patient Instructions (Addendum)
Do not take Xifaxan while on Bactrim. Can go back on Xifaxan when you finish Bactrim unless you're having  formed/normal stools. Take Imodium OTC 1 mg by mouth every morning. Take hyoscyamine sublingual 1 tablet before lunch and evening meal. Keep stool diary for next 4 weeks and call with progress report.

## 2016-11-20 DIAGNOSIS — Z6827 Body mass index (BMI) 27.0-27.9, adult: Secondary | ICD-10-CM | POA: Diagnosis not present

## 2016-11-20 DIAGNOSIS — F17210 Nicotine dependence, cigarettes, uncomplicated: Secondary | ICD-10-CM | POA: Diagnosis not present

## 2016-11-20 DIAGNOSIS — Z299 Encounter for prophylactic measures, unspecified: Secondary | ICD-10-CM | POA: Diagnosis not present

## 2016-11-20 DIAGNOSIS — Z713 Dietary counseling and surveillance: Secondary | ICD-10-CM | POA: Diagnosis not present

## 2016-11-20 DIAGNOSIS — J069 Acute upper respiratory infection, unspecified: Secondary | ICD-10-CM | POA: Diagnosis not present

## 2016-12-26 DIAGNOSIS — E663 Overweight: Secondary | ICD-10-CM | POA: Diagnosis not present

## 2016-12-26 DIAGNOSIS — Z713 Dietary counseling and surveillance: Secondary | ICD-10-CM | POA: Diagnosis not present

## 2016-12-26 DIAGNOSIS — J441 Chronic obstructive pulmonary disease with (acute) exacerbation: Secondary | ICD-10-CM | POA: Diagnosis not present

## 2016-12-26 DIAGNOSIS — F17210 Nicotine dependence, cigarettes, uncomplicated: Secondary | ICD-10-CM | POA: Diagnosis not present

## 2016-12-26 DIAGNOSIS — J449 Chronic obstructive pulmonary disease, unspecified: Secondary | ICD-10-CM | POA: Diagnosis not present

## 2016-12-26 DIAGNOSIS — Z299 Encounter for prophylactic measures, unspecified: Secondary | ICD-10-CM | POA: Diagnosis not present

## 2017-02-03 ENCOUNTER — Encounter (INDEPENDENT_AMBULATORY_CARE_PROVIDER_SITE_OTHER): Payer: Self-pay

## 2017-02-03 ENCOUNTER — Encounter (INDEPENDENT_AMBULATORY_CARE_PROVIDER_SITE_OTHER): Payer: Self-pay | Admitting: Internal Medicine

## 2017-02-03 ENCOUNTER — Encounter (INDEPENDENT_AMBULATORY_CARE_PROVIDER_SITE_OTHER): Payer: Self-pay | Admitting: *Deleted

## 2017-02-03 ENCOUNTER — Ambulatory Visit (INDEPENDENT_AMBULATORY_CARE_PROVIDER_SITE_OTHER): Payer: Medicare Other | Admitting: Internal Medicine

## 2017-02-03 VITALS — BP 128/74 | HR 72 | Temp 98.5°F | Resp 18 | Ht 65.0 in | Wt 168.1 lb

## 2017-02-03 DIAGNOSIS — K529 Noninfective gastroenteritis and colitis, unspecified: Secondary | ICD-10-CM

## 2017-02-03 MED ORDER — LOPERAMIDE HCL 2 MG PO TABS: 1.0000 mg | ORAL_TABLET | Freq: Every day | ORAL | 0 refills | Status: DC

## 2017-02-03 NOTE — Patient Instructions (Signed)
Small bowel follow-through to be scheduled.

## 2017-02-03 NOTE — Progress Notes (Signed)
Presenting complaint;  Follow-up for diarrhea.  Subjective:  Patient is 71 year old Caucasian female who is here for scheduled visit. She was last seen on 11/04/2016. She has Stool diary since her last visit. She states she had to be treated with various antibiotics for sinusitis and bronchitis. In November she was treated with Bactrim DS. In December 2017 she was treated with Augmentin 2 courses and last month she was given Z-Pak 2 courses. She says while she was on antibiotics she had one to 2 formed stools. She had no side effects. Now she is back on Xifaxan and having 2-3 stools per day. She has urgency. She has had one accident. She denies nocturnal diarrhea. She has not lost any weight.  Current Medications: Outpatient Encounter Prescriptions as of 02/03/2017  Medication Sig  . Ascorbic Acid (VITAMIN C) 1000 MG tablet Take 1,000 mg by mouth daily.  . Calcium Carbonate (CALCIUM 600 PO) Take 600 mg by mouth daily.  . Cholecalciferol (VITAMIN D PO) Take 50 mcg by mouth daily.  Marland Kitchen. DM-Phenylephrine-Acetaminophen (VICKS DAYQUIL COLD & FLU PO) Take by mouth. Patient states that she is taking 1 po in the morning and 1 po in the evening.  . Probiotic Product (PROBIOTIC DAILY PO) Take 1 capsule by mouth daily.  Marland Kitchen. XIFAXAN 550 MG TABS tablet TAKE 1 TABLET BY MOUTH THREE TIMES DAILY  . hyoscyamine (LEVSIN SL) 0.125 MG SL tablet Place 1 tablet (0.125 mg total) under the tongue every 6 (six) hours as needed. (Patient not taking: Reported on 11/04/2016)  . [DISCONTINUED] Levocetirizine Dihydrochloride (XYZAL PO) Take by mouth.  . [DISCONTINUED] loperamide (IMODIUM A-D) 2 MG tablet Take 0.5 tablets (1 mg total) by mouth daily before breakfast. (Patient not taking: Reported on 02/03/2017)  . [DISCONTINUED] sulfamethoxazole-trimethoprim (BACTRIM DS,SEPTRA DS) 800-160 MG tablet    No facility-administered encounter medications on file as of 02/03/2017.      Objective: Blood pressure 128/74, pulse 72,  temperature 98.5 F (36.9 C), temperature source Oral, resp. rate 18, height 5\' 5"  (1.651 m), weight 168 lb 1.6 oz (76.2 kg). Patient is alert and in no acute distress. Conjunctiva is pink. Sclera is nonicteric Oropharyngeal mucosa is normal. No neck masses or thyromegaly noted. Cardiac exam with regular rhythm normal S1 and S2. No murmur or gallop noted. Lungs are clear to auscultation. Abdomen is full. Bowel sounds are normal. On palpation abdomen is soft and nontender without organomegaly or masses.  No LE edema or clubbing noted.    Assessment:  #1. Chronic diarrhea. She has normal stools every time she is treated with antibiotic for various known GI conditions. It appears she has small bowel diarrhea. She may have small intestinal bacterial overgrowth.   Plan:  Imodium 2 mg by mouth every morning. Small bowel follow-through. Office visit in 3 months.

## 2017-02-10 ENCOUNTER — Ambulatory Visit (HOSPITAL_COMMUNITY)
Admission: RE | Admit: 2017-02-10 | Discharge: 2017-02-10 | Disposition: A | Discharge status: Home / Self Care | Payer: Medicare Other | Source: Ambulatory Visit | Attending: Internal Medicine | Admitting: Internal Medicine

## 2017-02-10 DIAGNOSIS — K529 Noninfective gastroenteritis and colitis, unspecified: Secondary | ICD-10-CM | POA: Diagnosis not present

## 2017-02-10 DIAGNOSIS — R197 Diarrhea, unspecified: Secondary | ICD-10-CM | POA: Diagnosis not present

## 2017-02-16 ENCOUNTER — Other Ambulatory Visit (INDEPENDENT_AMBULATORY_CARE_PROVIDER_SITE_OTHER): Payer: Self-pay | Admitting: Internal Medicine

## 2017-02-16 DIAGNOSIS — R197 Diarrhea, unspecified: Secondary | ICD-10-CM

## 2017-02-16 DIAGNOSIS — R9389 Abnormal findings on diagnostic imaging of other specified body structures: Secondary | ICD-10-CM

## 2017-02-16 DIAGNOSIS — R110 Nausea: Secondary | ICD-10-CM

## 2017-02-24 ENCOUNTER — Ambulatory Visit (HOSPITAL_COMMUNITY)
Admission: RE | Admit: 2017-02-24 | Discharge: 2017-02-24 | Disposition: A | Discharge status: Home / Self Care | Payer: Medicare Other | Source: Ambulatory Visit | Attending: Internal Medicine | Admitting: Internal Medicine

## 2017-02-24 DIAGNOSIS — R197 Diarrhea, unspecified: Secondary | ICD-10-CM | POA: Diagnosis not present

## 2017-02-24 DIAGNOSIS — R110 Nausea: Secondary | ICD-10-CM | POA: Diagnosis not present

## 2017-02-24 DIAGNOSIS — K5730 Diverticulosis of large intestine without perforation or abscess without bleeding: Secondary | ICD-10-CM | POA: Diagnosis not present

## 2017-02-24 DIAGNOSIS — N2889 Other specified disorders of kidney and ureter: Secondary | ICD-10-CM | POA: Diagnosis not present

## 2017-02-24 DIAGNOSIS — R938 Abnormal findings on diagnostic imaging of other specified body structures: Secondary | ICD-10-CM | POA: Diagnosis not present

## 2017-02-24 DIAGNOSIS — R9389 Abnormal findings on diagnostic imaging of other specified body structures: Secondary | ICD-10-CM

## 2017-02-24 DIAGNOSIS — I700 Atherosclerosis of aorta: Secondary | ICD-10-CM | POA: Diagnosis not present

## 2017-02-24 LAB — POCT I-STAT CREATININE: CREATININE: 0.7 mg/dL (ref 0.44–1.00)

## 2017-02-24 MED ORDER — IOPAMIDOL (ISOVUE-300) INJECTION 61%
100.0000 mL | Freq: Once | INTRAVENOUS | Status: AC | PRN
  Administered 2017-02-24: 100 mL via INTRAVENOUS

## 2017-02-26 ENCOUNTER — Encounter (INDEPENDENT_AMBULATORY_CARE_PROVIDER_SITE_OTHER): Payer: Self-pay | Admitting: Internal Medicine

## 2017-03-04 ENCOUNTER — Other Ambulatory Visit (INDEPENDENT_AMBULATORY_CARE_PROVIDER_SITE_OTHER): Payer: Self-pay | Admitting: Internal Medicine

## 2017-03-04 MED ORDER — RIFAXIMIN 550 MG PO TABS: 550.0000 mg | ORAL_TABLET | Freq: Three times a day (TID) | ORAL | 1 refills | Status: DC

## 2017-03-26 DIAGNOSIS — Z79899 Other long term (current) drug therapy: Secondary | ICD-10-CM | POA: Diagnosis not present

## 2017-03-26 DIAGNOSIS — E663 Overweight: Secondary | ICD-10-CM | POA: Diagnosis not present

## 2017-03-26 DIAGNOSIS — Z299 Encounter for prophylactic measures, unspecified: Secondary | ICD-10-CM | POA: Diagnosis not present

## 2017-03-26 DIAGNOSIS — J449 Chronic obstructive pulmonary disease, unspecified: Secondary | ICD-10-CM | POA: Diagnosis not present

## 2017-03-26 DIAGNOSIS — Z7189 Other specified counseling: Secondary | ICD-10-CM | POA: Diagnosis not present

## 2017-03-26 DIAGNOSIS — Z1389 Encounter for screening for other disorder: Secondary | ICD-10-CM | POA: Diagnosis not present

## 2017-03-26 DIAGNOSIS — F17210 Nicotine dependence, cigarettes, uncomplicated: Secondary | ICD-10-CM | POA: Diagnosis not present

## 2017-03-26 DIAGNOSIS — Z1211 Encounter for screening for malignant neoplasm of colon: Secondary | ICD-10-CM | POA: Diagnosis not present

## 2017-03-26 DIAGNOSIS — R5383 Other fatigue: Secondary | ICD-10-CM | POA: Diagnosis not present

## 2017-03-26 DIAGNOSIS — E7800 Pure hypercholesterolemia, unspecified: Secondary | ICD-10-CM | POA: Diagnosis not present

## 2017-03-26 DIAGNOSIS — Z0000 Encounter for general adult medical examination without abnormal findings: Secondary | ICD-10-CM | POA: Diagnosis not present

## 2017-03-26 DIAGNOSIS — K589 Irritable bowel syndrome without diarrhea: Secondary | ICD-10-CM | POA: Diagnosis not present

## 2017-04-02 DIAGNOSIS — J449 Chronic obstructive pulmonary disease, unspecified: Secondary | ICD-10-CM | POA: Diagnosis not present

## 2017-04-02 DIAGNOSIS — J069 Acute upper respiratory infection, unspecified: Secondary | ICD-10-CM | POA: Diagnosis not present

## 2017-04-02 DIAGNOSIS — F17210 Nicotine dependence, cigarettes, uncomplicated: Secondary | ICD-10-CM | POA: Diagnosis not present

## 2017-04-02 DIAGNOSIS — Z299 Encounter for prophylactic measures, unspecified: Secondary | ICD-10-CM | POA: Diagnosis not present

## 2017-04-02 DIAGNOSIS — K589 Irritable bowel syndrome without diarrhea: Secondary | ICD-10-CM | POA: Diagnosis not present

## 2017-04-02 DIAGNOSIS — E663 Overweight: Secondary | ICD-10-CM | POA: Diagnosis not present

## 2017-04-02 DIAGNOSIS — K210 Gastro-esophageal reflux disease with esophagitis: Secondary | ICD-10-CM | POA: Diagnosis not present

## 2017-04-27 DIAGNOSIS — F17210 Nicotine dependence, cigarettes, uncomplicated: Secondary | ICD-10-CM | POA: Diagnosis not present

## 2017-04-27 DIAGNOSIS — J069 Acute upper respiratory infection, unspecified: Secondary | ICD-10-CM | POA: Diagnosis not present

## 2017-04-27 DIAGNOSIS — Z713 Dietary counseling and surveillance: Secondary | ICD-10-CM | POA: Diagnosis not present

## 2017-04-27 DIAGNOSIS — Z299 Encounter for prophylactic measures, unspecified: Secondary | ICD-10-CM | POA: Diagnosis not present

## 2017-04-27 DIAGNOSIS — Z6828 Body mass index (BMI) 28.0-28.9, adult: Secondary | ICD-10-CM | POA: Diagnosis not present

## 2017-05-04 DIAGNOSIS — Z1231 Encounter for screening mammogram for malignant neoplasm of breast: Secondary | ICD-10-CM | POA: Diagnosis not present

## 2017-05-04 DIAGNOSIS — H04123 Dry eye syndrome of bilateral lacrimal glands: Secondary | ICD-10-CM | POA: Diagnosis not present

## 2017-05-26 ENCOUNTER — Ambulatory Visit (INDEPENDENT_AMBULATORY_CARE_PROVIDER_SITE_OTHER): Payer: Medicare Other | Admitting: Internal Medicine

## 2017-06-01 DIAGNOSIS — H40053 Ocular hypertension, bilateral: Secondary | ICD-10-CM | POA: Diagnosis not present

## 2017-06-01 DIAGNOSIS — H524 Presbyopia: Secondary | ICD-10-CM | POA: Diagnosis not present

## 2017-06-08 DIAGNOSIS — Q6211 Congenital occlusion of ureteropelvic junction: Secondary | ICD-10-CM | POA: Diagnosis not present

## 2017-06-08 DIAGNOSIS — N3941 Urge incontinence: Secondary | ICD-10-CM | POA: Diagnosis not present

## 2017-07-07 ENCOUNTER — Ambulatory Visit (INDEPENDENT_AMBULATORY_CARE_PROVIDER_SITE_OTHER): Payer: Medicare Other | Admitting: Internal Medicine

## 2017-07-07 ENCOUNTER — Encounter (INDEPENDENT_AMBULATORY_CARE_PROVIDER_SITE_OTHER): Payer: Self-pay

## 2017-07-07 ENCOUNTER — Encounter (INDEPENDENT_AMBULATORY_CARE_PROVIDER_SITE_OTHER): Payer: Self-pay | Admitting: Internal Medicine

## 2017-07-07 VITALS — BP 120/88 | HR 65 | Temp 98.5°F | Resp 18 | Ht 65.0 in | Wt 165.0 lb

## 2017-07-07 DIAGNOSIS — K529 Noninfective gastroenteritis and colitis, unspecified: Secondary | ICD-10-CM | POA: Diagnosis not present

## 2017-07-07 MED ORDER — PB-HYOSCY-ATROPINE-SCOPOLAMINE 16.2 MG/5ML PO ELIX: 10.0000 mL | ORAL_SOLUTION | Freq: Three times a day (TID) | ORAL | 2 refills | Status: DC

## 2017-07-07 NOTE — Patient Instructions (Signed)
Keep stool diary as to frequency and consistency of stools and call with progress report in 3-4 weeks.

## 2017-07-08 ENCOUNTER — Other Ambulatory Visit (INDEPENDENT_AMBULATORY_CARE_PROVIDER_SITE_OTHER): Payer: Self-pay | Admitting: Internal Medicine

## 2017-07-08 DIAGNOSIS — K529 Noninfective gastroenteritis and colitis, unspecified: Secondary | ICD-10-CM | POA: Diagnosis not present

## 2017-07-08 NOTE — Progress Notes (Signed)
Presenting complaint;  Follow-up for chronic diarrhea.  Database and Subjective:  Patient is 71 year old Caucasian female who was chronic diarrhea of over 6 years duration and is here for scheduled visit. Prior to her visit to our office she had stool studies and colonoscopy and then when [place] and the studies are unremarkable. She has been treated for IBS but without symptomatic improvement. She has reported improvement in her diarrhea every time she has taken an antibiotic. Therefore I felt she may have small intestinal bacterial overgrowth. Following her last visit on 02/03/2017 she underwent small bowel follow-through which revealed focal abnormality to ileum but subsequent CT enterography was unremarkable.  Patient remains with diarrhea. She has anywhere from 1-6 stools per day. On her good days she has 1-3 in on her worst days as many as 6 stools per day. First stool usually has formed and the second stool is formed mushy or loose and rest of the stools are loose. She has had multiple accidents. She is also having nocturnal bowel movement. She has daily abdominal cramping and nausea but no vomiting. Her appetite is good but she is afraid to eat because it triggers her symptoms. She has only lost 3 pounds since her last visit. She reports she took Augmentin for 5 days in April and did not see any symptomatic improvement. She took Augmentin and may this year for 10 days and noted very little if any improvement in her diarrhea. She was given antibiotic for sinusitis etc.  She is interested in trying Donnatal elixir. She states her sister took this medication with resolution for diarrhea.   Current Medications: Outpatient Encounter Prescriptions as of 07/07/2017  Medication Sig  . Ascorbic Acid (VITAMIN C) 1000 MG tablet Take 1,000 mg by mouth daily.  . Calcium Carbonate (CALCIUM 600 PO) Take 600 mg by mouth daily.  . Cholecalciferol (VITAMIN D PO) Take 50 mcg by mouth daily.  Marland Kitchen.  levocetirizine (XYZAL) 5 MG tablet Take 5 mg by mouth daily.  Marland Kitchen. loperamide (IMODIUM A-D) 2 MG tablet Take 0.5 tablets (1 mg total) by mouth daily.  . Probiotic Product (PROBIOTIC DAILY PO) Take 1 capsule by mouth daily.  . ranitidine (ZANTAC) 75 MG tablet Take 75 mg by mouth daily.  . belladonna-PHENObarbital (DONNATAL) 16.2 MG/5ML ELIX Take 10 mLs (32.4 mg total) by mouth 3 (three) times daily before meals.  . [DISCONTINUED] DM-Phenylephrine-Acetaminophen (VICKS DAYQUIL COLD & FLU PO) Take by mouth. Patient states that she is taking 1 po in the morning and 1 po in the evening.  . [DISCONTINUED] rifaximin (XIFAXAN) 550 MG TABS tablet Take 1 tablet (550 mg total) by mouth 3 (three) times daily. (Patient not taking: Reported on 07/07/2017)   No facility-administered encounter medications on file as of 07/07/2017.      Objective: Blood pressure 120/88, pulse 65, temperature 98.5 F (36.9 C), temperature source Oral, resp. rate 18, height 5\' 5"  (1.651 m), weight 165 lb (74.8 kg). Patient is alert and in no acute distress. Conjunctiva is pink. Sclera is nonicteric Oropharyngeal mucosa is normal. No neck masses or thyromegaly noted. Cardiac exam with regular rhythm normal S1 and S2. No murmur or gallop noted. Lungs are clear to auscultation. AbdomenIs full. Bowel sounds are hyperactive. On palpation abdomen is soft with mild generalized cigarettes. No organomegaly or masses.  No LE edema or clubbing noted.  Labs/studies Results: Small bowel follow-through 1 02/10/2017 . Focal abnormality noted in the ileum on the 60 minute image in the right mid abdomen only  on one view.  CT enterography on 02/24/2017. No small bowel abnormality noted. Colonic diverticulosis. Moderate left renal pelviectasis without dilated ureter. Study would do with Dr. Tyron RussellBoles and he felt left renal abnormality was in significant   Assessment:  #1. Chronic diarrhea with negative Workup in the past including stool studies  and colonoscopy( [place]). Given history of symptomatic improvement with antibiotics she was suspected to have small bowel bacterial overgrowth. Recent ten-day treatment with Augmentin failed to provide any relief. Therefore need to look for other causes including celiac disease. She simply could have refractory IBS. Will try her on Donnatal which helped her sister with diarrhea.   Plan:  Donnatal elixir 5-10 mL by mouth before meals. Patient will keep stool diary and call with progress report in 3-4 weeks. Unless she responds to this therapy will consider trial with metronidazole followed by small bowel series and quantitative fecal fat analysis. Office visit in 3 months.

## 2017-07-09 ENCOUNTER — Encounter (INDEPENDENT_AMBULATORY_CARE_PROVIDER_SITE_OTHER): Payer: Self-pay | Admitting: Internal Medicine

## 2017-07-10 LAB — CELIAC DISEASE PANEL
Endomysial IgA: NEGATIVE
IgA/Immunoglobulin A, Serum: 200 mg/dL (ref 64–422)
Transglutaminase IgA: <2 U/mL (ref 0–3)

## 2017-07-16 DIAGNOSIS — N393 Stress incontinence (female) (male): Secondary | ICD-10-CM | POA: Diagnosis not present

## 2017-07-16 DIAGNOSIS — Q6211 Congenital occlusion of ureteropelvic junction: Secondary | ICD-10-CM | POA: Diagnosis not present

## 2017-07-16 DIAGNOSIS — N3281 Overactive bladder: Secondary | ICD-10-CM | POA: Diagnosis not present

## 2017-10-06 ENCOUNTER — Ambulatory Visit (INDEPENDENT_AMBULATORY_CARE_PROVIDER_SITE_OTHER): Payer: Medicare Other | Admitting: Internal Medicine

## 2017-10-06 ENCOUNTER — Encounter (INDEPENDENT_AMBULATORY_CARE_PROVIDER_SITE_OTHER): Payer: Self-pay

## 2017-10-06 ENCOUNTER — Encounter (INDEPENDENT_AMBULATORY_CARE_PROVIDER_SITE_OTHER): Payer: Self-pay | Admitting: Internal Medicine

## 2017-10-06 VITALS — BP 108/60 | HR 68 | Temp 98.4°F | Resp 18 | Ht 65.0 in | Wt 163.7 lb

## 2017-10-06 DIAGNOSIS — K580 Irritable bowel syndrome with diarrhea: Secondary | ICD-10-CM | POA: Diagnosis not present

## 2017-10-06 MED ORDER — BENEFIBER DRINK MIX PO PACK: 4.0000 g | PACK | Freq: Every day | ORAL | Status: DC

## 2017-10-06 MED ORDER — HYOSCYAMINE SULFATE ER 0.375 MG PO TB12: 0.3750 mg | ORAL_TABLET | Freq: Every day | ORAL | 1 refills | Status: DC

## 2017-10-06 NOTE — Progress Notes (Signed)
Presenting complaint;  Follow-up for diarrhea.  Subjective:  Patient is 71 year old Caucasian female who has chronic diarrhea and has undergone extensive workup without a definite diagnosis. In the past she has responded to course of antibiotic therapy suggesting small bowel bacterial overgrowth. She did not respond to anti-spasmodic therapy. On her last visit she told me that her sister had diarrhea and she took Donnatal and diarrhea resolved. On her last visit she was given prescription for Donnatal elixir. She took it for a few days and stopped because of headache. She believes it did help but not completely. She has Stool diary as recommended. Now she is having diarrhea and at times she is constipated and every now and then she has formed stool. Stool diary was reviewed with the patient. She has Diary for the last 6 weeks. She had 3 bad days with diarrhea. On most days she has first formed stool and second or third stools if she has and they're loose. On few occasions she's been constipated and experience left-sided pain with bowel movement. Overall she feels she is some better. She has not lost any weight. Her appetite is good.   Current Medications: Outpatient Encounter Prescriptions as of 10/06/2017  Medication Sig  . Ascorbic Acid (VITAMIN C) 1000 MG tablet Take 1,000 mg by mouth daily.  . Calcium Carbonate (CALCIUM 600 PO) Take 600 mg by mouth daily.  . Cholecalciferol (VITAMIN D PO) Take 50 mcg by mouth daily.  . fexofenadine (ALLEGRA) 180 MG tablet Take 180 mg by mouth daily.  Marland Kitchen. loperamide (IMODIUM A-D) 2 MG tablet Take 0.5 tablets (1 mg total) by mouth daily.  . ranitidine (ZANTAC) 75 MG tablet Take 75 mg by mouth daily.  . belladonna-PHENObarbital (DONNATAL) 16.2 MG/5ML ELIX Take 10 mLs (32.4 mg total) by mouth 3 (three) times daily before meals. (Patient not taking: Reported on 10/06/2017)  . [DISCONTINUED] levocetirizine (XYZAL) 5 MG tablet Take 5 mg by mouth daily.  .  [DISCONTINUED] Probiotic Product (PROBIOTIC DAILY PO) Take 1 capsule by mouth daily.   No facility-administered encounter medications on file as of 10/06/2017.      Objective: Blood pressure 108/60, pulse 68, temperature 98.4 F (36.9 C), temperature source Oral, resp. rate 18, height 5\' 5"  (1.651 m), weight 163 lb 11.2 oz (74.3 kg). Patient is alert and in no acute distress. Conjunctiva is pink. Sclera is nonicteric Oropharyngeal mucosa is normal. No neck masses or thyromegaly noted. Cardiac exam with regular rhythm normal S1 and S2. No murmur or gallop noted. Lungs are clear to auscultation. Abdomen is full. Bowel sounds are normal. On palpation abdomen is soft with mild tenderness at RLQ and LLQ. No organomegaly or masses. No LE edema or clubbing noted.   Assessment:  #1. Chronic diarrhea. Patient has undergone extensive evaluation and without definite diagnosis. Small bowel study had suggested a stricture but CT enterography was unremarkable. I suspect we are dealing with difficult to treat irritable bowel syndrome. It is reassuring to know that she is actually doing better.   Plan:  Benefiber 4 g by mouth daily at bedtime. Levbid 1/2  to 1 tablet by mouth every morning. Patient will call with progress report in one month and return for office visit in 6 months.

## 2017-10-06 NOTE — Patient Instructions (Addendum)
Please call office with progress report in 4 weeks. Take half a tablet of hyoscyamine to begin with; if it does not work and you have no side effects can increase to one tablet every morning

## 2018-01-29 DIAGNOSIS — Z299 Encounter for prophylactic measures, unspecified: Secondary | ICD-10-CM | POA: Diagnosis not present

## 2018-01-29 DIAGNOSIS — J111 Influenza due to unidentified influenza virus with other respiratory manifestations: Secondary | ICD-10-CM | POA: Diagnosis not present

## 2018-01-29 DIAGNOSIS — R6889 Other general symptoms and signs: Secondary | ICD-10-CM | POA: Diagnosis not present

## 2018-01-29 DIAGNOSIS — Z6828 Body mass index (BMI) 28.0-28.9, adult: Secondary | ICD-10-CM | POA: Diagnosis not present

## 2018-01-29 DIAGNOSIS — Z713 Dietary counseling and surveillance: Secondary | ICD-10-CM | POA: Diagnosis not present

## 2018-02-08 DIAGNOSIS — F17210 Nicotine dependence, cigarettes, uncomplicated: Secondary | ICD-10-CM | POA: Diagnosis not present

## 2018-02-08 DIAGNOSIS — Z299 Encounter for prophylactic measures, unspecified: Secondary | ICD-10-CM | POA: Diagnosis not present

## 2018-02-08 DIAGNOSIS — J069 Acute upper respiratory infection, unspecified: Secondary | ICD-10-CM | POA: Diagnosis not present

## 2018-02-08 DIAGNOSIS — J449 Chronic obstructive pulmonary disease, unspecified: Secondary | ICD-10-CM | POA: Diagnosis not present

## 2018-02-08 DIAGNOSIS — J441 Chronic obstructive pulmonary disease with (acute) exacerbation: Secondary | ICD-10-CM | POA: Diagnosis not present

## 2018-02-08 DIAGNOSIS — Z6828 Body mass index (BMI) 28.0-28.9, adult: Secondary | ICD-10-CM | POA: Diagnosis not present

## 2018-03-06 DIAGNOSIS — J069 Acute upper respiratory infection, unspecified: Secondary | ICD-10-CM | POA: Diagnosis not present

## 2018-03-10 DIAGNOSIS — J329 Chronic sinusitis, unspecified: Secondary | ICD-10-CM | POA: Diagnosis not present

## 2018-03-10 DIAGNOSIS — Z299 Encounter for prophylactic measures, unspecified: Secondary | ICD-10-CM | POA: Diagnosis not present

## 2018-03-10 DIAGNOSIS — F17210 Nicotine dependence, cigarettes, uncomplicated: Secondary | ICD-10-CM | POA: Diagnosis not present

## 2018-03-10 DIAGNOSIS — Z2821 Immunization not carried out because of patient refusal: Secondary | ICD-10-CM | POA: Diagnosis not present

## 2018-03-10 DIAGNOSIS — J449 Chronic obstructive pulmonary disease, unspecified: Secondary | ICD-10-CM | POA: Diagnosis not present

## 2018-03-10 DIAGNOSIS — Z6828 Body mass index (BMI) 28.0-28.9, adult: Secondary | ICD-10-CM | POA: Diagnosis not present

## 2018-03-10 DIAGNOSIS — H6980 Other specified disorders of Eustachian tube, unspecified ear: Secondary | ICD-10-CM | POA: Diagnosis not present

## 2018-03-15 DIAGNOSIS — Z299 Encounter for prophylactic measures, unspecified: Secondary | ICD-10-CM | POA: Diagnosis not present

## 2018-03-15 DIAGNOSIS — Z6827 Body mass index (BMI) 27.0-27.9, adult: Secondary | ICD-10-CM | POA: Diagnosis not present

## 2018-03-15 DIAGNOSIS — J449 Chronic obstructive pulmonary disease, unspecified: Secondary | ICD-10-CM | POA: Diagnosis not present

## 2018-03-15 DIAGNOSIS — F17210 Nicotine dependence, cigarettes, uncomplicated: Secondary | ICD-10-CM | POA: Diagnosis not present

## 2018-03-15 DIAGNOSIS — H6980 Other specified disorders of Eustachian tube, unspecified ear: Secondary | ICD-10-CM | POA: Diagnosis not present

## 2018-03-24 DIAGNOSIS — Z01419 Encounter for gynecological examination (general) (routine) without abnormal findings: Secondary | ICD-10-CM | POA: Diagnosis not present

## 2018-03-29 DIAGNOSIS — F17210 Nicotine dependence, cigarettes, uncomplicated: Secondary | ICD-10-CM | POA: Diagnosis not present

## 2018-03-29 DIAGNOSIS — Z713 Dietary counseling and surveillance: Secondary | ICD-10-CM | POA: Diagnosis not present

## 2018-03-29 DIAGNOSIS — H6980 Other specified disorders of Eustachian tube, unspecified ear: Secondary | ICD-10-CM | POA: Diagnosis not present

## 2018-03-29 DIAGNOSIS — Z299 Encounter for prophylactic measures, unspecified: Secondary | ICD-10-CM | POA: Diagnosis not present

## 2018-03-29 DIAGNOSIS — Z6827 Body mass index (BMI) 27.0-27.9, adult: Secondary | ICD-10-CM | POA: Diagnosis not present

## 2018-04-06 ENCOUNTER — Ambulatory Visit (INDEPENDENT_AMBULATORY_CARE_PROVIDER_SITE_OTHER): Payer: Medicare Other | Admitting: Internal Medicine

## 2018-04-06 ENCOUNTER — Encounter (INDEPENDENT_AMBULATORY_CARE_PROVIDER_SITE_OTHER): Payer: Self-pay | Admitting: Internal Medicine

## 2018-04-06 VITALS — BP 130/76 | HR 66 | Temp 98.5°F | Resp 18 | Ht 65.0 in | Wt 157.5 lb

## 2018-04-06 DIAGNOSIS — K580 Irritable bowel syndrome with diarrhea: Secondary | ICD-10-CM | POA: Diagnosis not present

## 2018-04-06 MED ORDER — HYOSCYAMINE SULFATE ER 0.375 MG PO TB12: 0.3750 mg | ORAL_TABLET | Freq: Every day | ORAL | 5 refills | Status: DC

## 2018-04-06 NOTE — Patient Instructions (Signed)
Weight check in 2 months 

## 2018-04-06 NOTE — Progress Notes (Signed)
Presenting complaint;  Follow-up for chronic diarrhea/IBS.  Database and subjective:  Patient is 72 year old Caucasian female who has chronic diarrhea and underwent extensive work-up.  She is felt to have refractory IBS.  She was doing better on her last visit.  She says she is still having diarrhea intermittently but not like previously when she would have diarrhea all day long.  On most days she has 2-5 stools.  Every now and then she may have more.  Her first stool is usually normal and subsequent stools are semi-formed to loose.  She is using hyoscyamine on as-needed basis.  She has not taken Imodium and 8 weeks or so.  When she does take Imodium she takes half a tablet or 1 mg.  When she takes 2 mg she becomes constipated.  Every now and then she may skip a day.  She denies abdominal pain or rectal bleeding.  She says her appetite is good but she has cut back.  She has lost 6 pounds since her last visit and she is quite pleased. She is on Augmentin for sinusitis and ear infection.  She has 3 or 4 more days left.  She has not noted any change in stool frequency since she has been on antibiotic.   Current Medications: Outpatient Encounter Medications as of 04/06/2018  Medication Sig  . amoxicillin-clavulanate (AUGMENTIN) 875-125 MG tablet Take 1 tablet by mouth 2 (two) times daily with a meal.  . Ascorbic Acid (VITAMIN C) 1000 MG tablet Take 1,000 mg by mouth daily.  . Cholecalciferol (VITAMIN D PO) Take 50 mcg by mouth daily.  . hyoscyamine (LEVBID) 0.375 MG 12 hr tablet Take 1 tablet (0.375 mg total) by mouth daily before breakfast.  . levocetirizine (XYZAL) 5 MG tablet Take 5 mg by mouth daily with lunch.  . loperamide (IMODIUM A-D) 2 MG tablet Take 0.5 tablets (1 mg total) by mouth daily.  . ranitidine (ZANTAC) 75 MG tablet Take 75 mg by mouth daily.  . Calcium Carbonate (CALCIUM 600 PO) Take 600 mg by mouth daily.  . [DISCONTINUED] belladonna-PHENObarbital (DONNATAL) 16.2 MG/5ML ELIX Take  10 mLs (32.4 mg total) by mouth 3 (three) times daily before meals. (Patient not taking: Reported on 10/06/2017)  . [DISCONTINUED] fexofenadine (ALLEGRA) 180 MG tablet Take 180 mg by mouth daily.  . [DISCONTINUED] Wheat Dextrin (BENEFIBER DRINK MIX) PACK Take 4 g by mouth at bedtime. (Patient not taking: Reported on 04/06/2018)   No facility-administered encounter medications on file as of 04/06/2018.      Objective: Blood pressure 130/76, pulse 66, temperature 98.5 F (36.9 C), temperature source Oral, resp. rate 18, height 5\' 5"  (1.651 m), weight 157 lb 8 oz (71.4 kg). Patient is alert and in no acute distress. Conjunctiva is pink. Sclera is nonicteric Oropharyngeal mucosa is normal. No neck masses or thyromegaly noted. Cardiac exam with regular rhythm normal S1 and S2. No murmur or gallop noted. Lungs are clear to auscultation. Abdomen is symmetrical.  Bowel sounds are normal.  On palpation abdomen is soft with mild generalized tenderness without guarding or rebound.  No organomegaly or masses. No LE edema or clubbing noted.   Assessment:  #1.  Irritable bowel syndrome/diarrhea.  She is doing better but she is using medication on a as needed basis.  Since she is having frequent diarrhea she needs to be taking hyoscyamine daily.  She can take either half or 1 tablet daily as long as she is not having any side effects.  She will continue  to use loperamide OTC on as-needed basis.  #2.  Involuntary weight loss.  She has lost 6 pounds since her last visit.  She has no worrisome symptoms.  Weight loss may be due to diminished calorie intake.  She is happy about this weight loss.  Plan:  Patient advised to take hyoscyamine half to 1 tablet every morning and use loperamide OTC on as-needed basis. New prescription for hyoscyamine 0.375 given 1 month supply with 5 refills. Weight check in 2 months. Office visit in 1 year.

## 2018-04-12 ENCOUNTER — Ambulatory Visit (INDEPENDENT_AMBULATORY_CARE_PROVIDER_SITE_OTHER): Payer: Medicare Other | Admitting: Otolaryngology

## 2018-04-12 DIAGNOSIS — H6983 Other specified disorders of Eustachian tube, bilateral: Secondary | ICD-10-CM

## 2018-04-12 DIAGNOSIS — H906 Mixed conductive and sensorineural hearing loss, bilateral: Secondary | ICD-10-CM | POA: Diagnosis not present

## 2018-05-06 DIAGNOSIS — Z1231 Encounter for screening mammogram for malignant neoplasm of breast: Secondary | ICD-10-CM | POA: Diagnosis not present

## 2018-05-06 DIAGNOSIS — R928 Other abnormal and inconclusive findings on diagnostic imaging of breast: Secondary | ICD-10-CM | POA: Diagnosis not present

## 2018-05-12 DIAGNOSIS — R928 Other abnormal and inconclusive findings on diagnostic imaging of breast: Secondary | ICD-10-CM | POA: Diagnosis not present

## 2018-05-13 ENCOUNTER — Ambulatory Visit (INDEPENDENT_AMBULATORY_CARE_PROVIDER_SITE_OTHER): Payer: Medicare Other | Admitting: Otolaryngology

## 2018-05-13 DIAGNOSIS — E7800 Pure hypercholesterolemia, unspecified: Secondary | ICD-10-CM | POA: Diagnosis not present

## 2018-05-13 DIAGNOSIS — R5383 Other fatigue: Secondary | ICD-10-CM | POA: Diagnosis not present

## 2018-05-13 DIAGNOSIS — J449 Chronic obstructive pulmonary disease, unspecified: Secondary | ICD-10-CM | POA: Diagnosis not present

## 2018-05-13 DIAGNOSIS — Z0000 Encounter for general adult medical examination without abnormal findings: Secondary | ICD-10-CM | POA: Diagnosis not present

## 2018-05-13 DIAGNOSIS — H6983 Other specified disorders of Eustachian tube, bilateral: Secondary | ICD-10-CM | POA: Diagnosis not present

## 2018-05-13 DIAGNOSIS — Z1331 Encounter for screening for depression: Secondary | ICD-10-CM | POA: Diagnosis not present

## 2018-05-13 DIAGNOSIS — Z6826 Body mass index (BMI) 26.0-26.9, adult: Secondary | ICD-10-CM | POA: Diagnosis not present

## 2018-05-13 DIAGNOSIS — Z1339 Encounter for screening examination for other mental health and behavioral disorders: Secondary | ICD-10-CM | POA: Diagnosis not present

## 2018-05-13 DIAGNOSIS — H903 Sensorineural hearing loss, bilateral: Secondary | ICD-10-CM | POA: Diagnosis not present

## 2018-05-13 DIAGNOSIS — Z299 Encounter for prophylactic measures, unspecified: Secondary | ICD-10-CM | POA: Diagnosis not present

## 2018-05-13 DIAGNOSIS — Z1211 Encounter for screening for malignant neoplasm of colon: Secondary | ICD-10-CM | POA: Diagnosis not present

## 2018-05-13 DIAGNOSIS — Z7189 Other specified counseling: Secondary | ICD-10-CM | POA: Diagnosis not present

## 2018-05-13 DIAGNOSIS — F17210 Nicotine dependence, cigarettes, uncomplicated: Secondary | ICD-10-CM | POA: Diagnosis not present

## 2018-07-15 ENCOUNTER — Ambulatory Visit (INDEPENDENT_AMBULATORY_CARE_PROVIDER_SITE_OTHER): Payer: Medicare Other | Admitting: Otolaryngology

## 2018-07-15 ENCOUNTER — Telehealth (INDEPENDENT_AMBULATORY_CARE_PROVIDER_SITE_OTHER): Payer: Self-pay | Admitting: *Deleted

## 2018-07-15 DIAGNOSIS — H6983 Other specified disorders of Eustachian tube, bilateral: Secondary | ICD-10-CM

## 2018-07-15 NOTE — Telephone Encounter (Signed)
Suzanne Combs presented to the office today foe a weight check post her office visit 04/06/2018. Her weight on 04/06/18 was 157 lb 8 oz. Today the patient weighed 161 lbs 1 oz.  Patient advised this would be given to Dr.Rehman and we would contact her with any further recommendations.

## 2018-07-16 NOTE — Telephone Encounter (Signed)
Patient has gained 4 pounds. Will check weight at next OV office visit.

## 2018-11-29 DIAGNOSIS — J069 Acute upper respiratory infection, unspecified: Secondary | ICD-10-CM | POA: Diagnosis not present

## 2018-11-29 DIAGNOSIS — Z299 Encounter for prophylactic measures, unspecified: Secondary | ICD-10-CM | POA: Diagnosis not present

## 2018-11-29 DIAGNOSIS — Z6827 Body mass index (BMI) 27.0-27.9, adult: Secondary | ICD-10-CM | POA: Diagnosis not present

## 2018-12-20 DIAGNOSIS — J449 Chronic obstructive pulmonary disease, unspecified: Secondary | ICD-10-CM | POA: Diagnosis not present

## 2018-12-20 DIAGNOSIS — F17210 Nicotine dependence, cigarettes, uncomplicated: Secondary | ICD-10-CM | POA: Diagnosis not present

## 2018-12-20 DIAGNOSIS — Z299 Encounter for prophylactic measures, unspecified: Secondary | ICD-10-CM | POA: Diagnosis not present

## 2018-12-20 DIAGNOSIS — Z2821 Immunization not carried out because of patient refusal: Secondary | ICD-10-CM | POA: Diagnosis not present

## 2018-12-20 DIAGNOSIS — J069 Acute upper respiratory infection, unspecified: Secondary | ICD-10-CM | POA: Diagnosis not present

## 2018-12-20 DIAGNOSIS — Z6827 Body mass index (BMI) 27.0-27.9, adult: Secondary | ICD-10-CM | POA: Diagnosis not present

## 2019-01-20 ENCOUNTER — Ambulatory Visit (INDEPENDENT_AMBULATORY_CARE_PROVIDER_SITE_OTHER): Payer: Medicare Other | Admitting: Otolaryngology

## 2019-01-20 DIAGNOSIS — H903 Sensorineural hearing loss, bilateral: Secondary | ICD-10-CM | POA: Diagnosis not present

## 2019-01-20 DIAGNOSIS — H6983 Other specified disorders of Eustachian tube, bilateral: Secondary | ICD-10-CM

## 2019-02-20 IMAGING — CR DG SMALL BOWEL
14 of 20 series · 14 of 20 positions shown · non-contrast
Comparison: None ; correlation CT abdomen and pelvis 10/01/2010

CLINICAL DATA: Chronic diarrhea since 9177, intermittent nausea,
history GERD, hiatal hernia

EXAM:
SMALL BOWEL SERIES
TECHNIQUE: Following ingestion of thin barium, serial small bowel images were
obtained including spot views of the terminal ileum.
FLUOROSCOPY TIME:  Fluoroscopy Time:  1 minutes 12 seconds
Radiation Exposure Index (if provided by the fluoroscopic device):
63.90
Number of Acquired Spot Images: 6 plus multiple screen captures
during fluoroscopy

[t abdomen supine (1 of 2)]
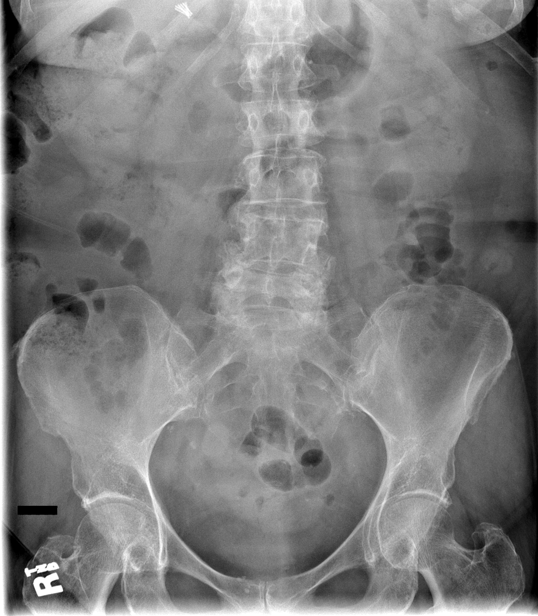

[t abdomen barium (1 of 2)]
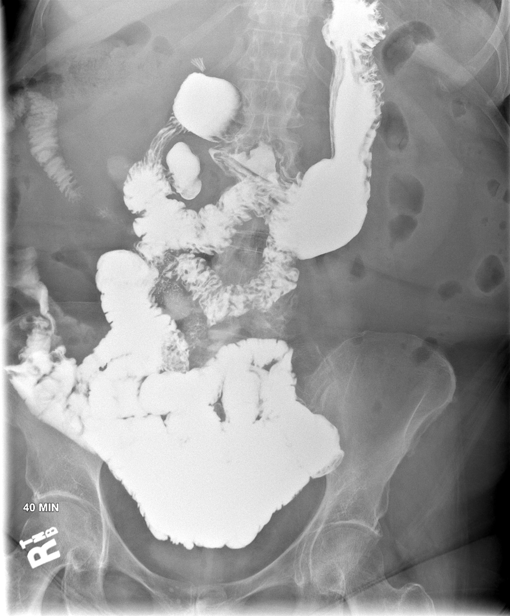

[t abdomen barium (2 of 2)]
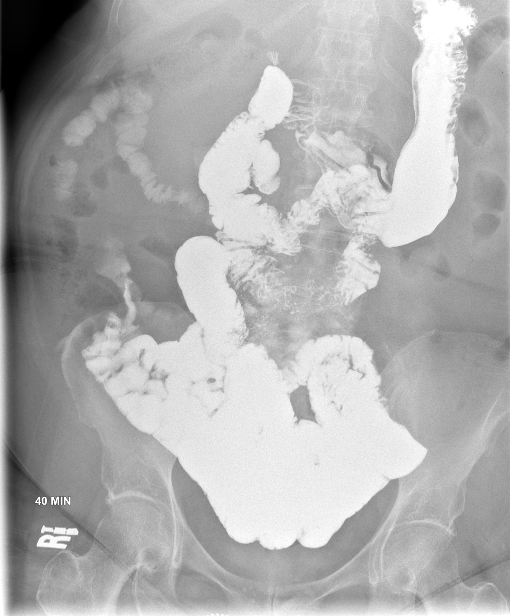

[t abdomen supine (2 of 2)]
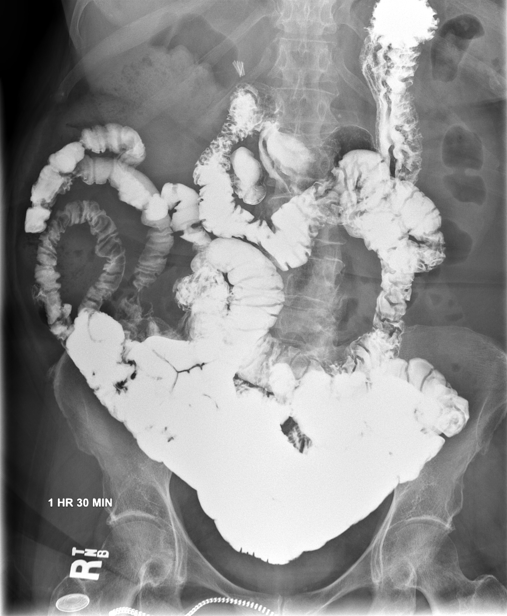

[cp_standard (1 of 10)]
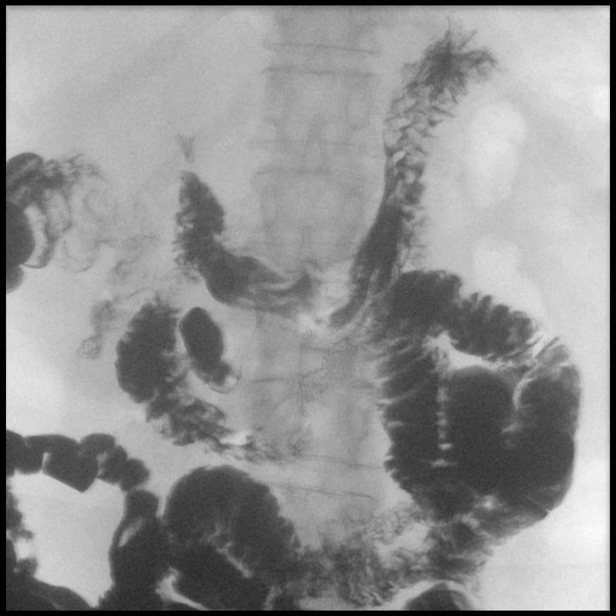

[cp_standard (2 of 10)]
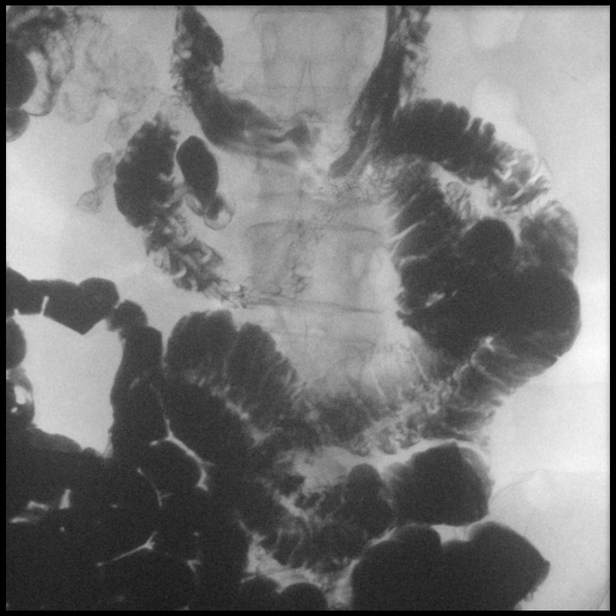

[cp_standard (3 of 10)]
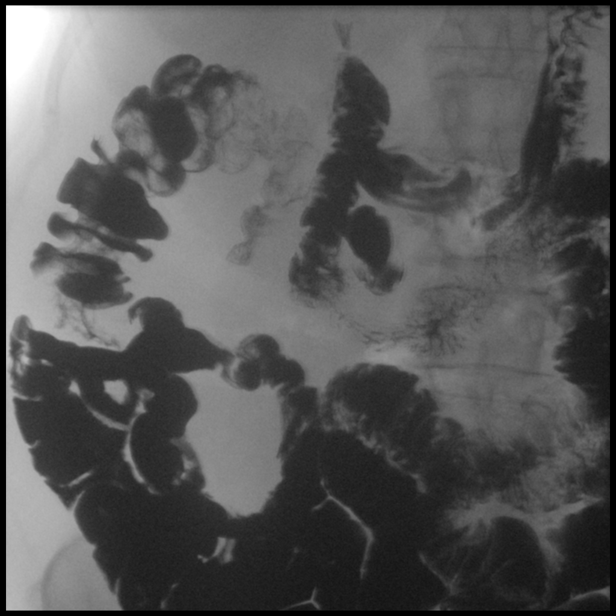

[cp_standard (4 of 10)]
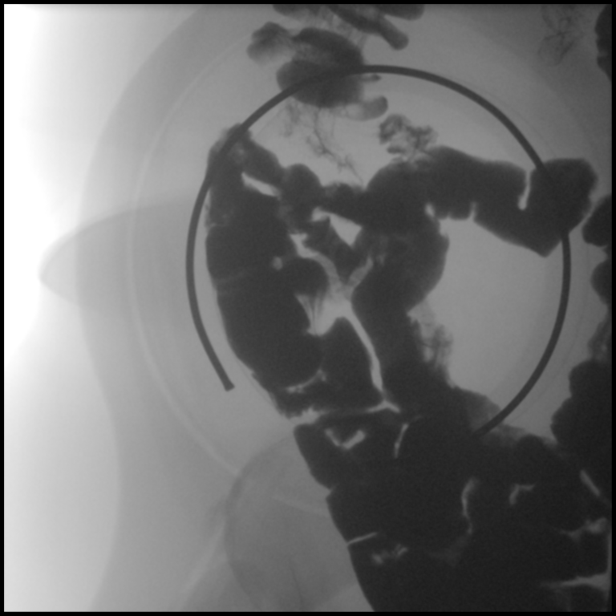

[cp_standard (5 of 10)]
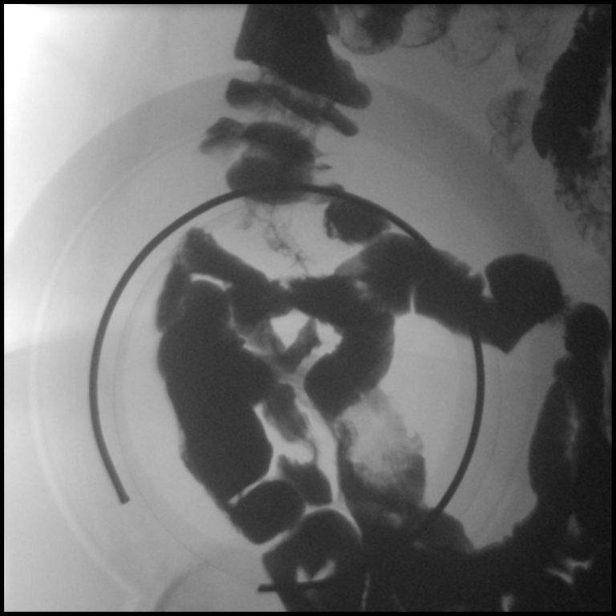

[cp_standard (6 of 10)]
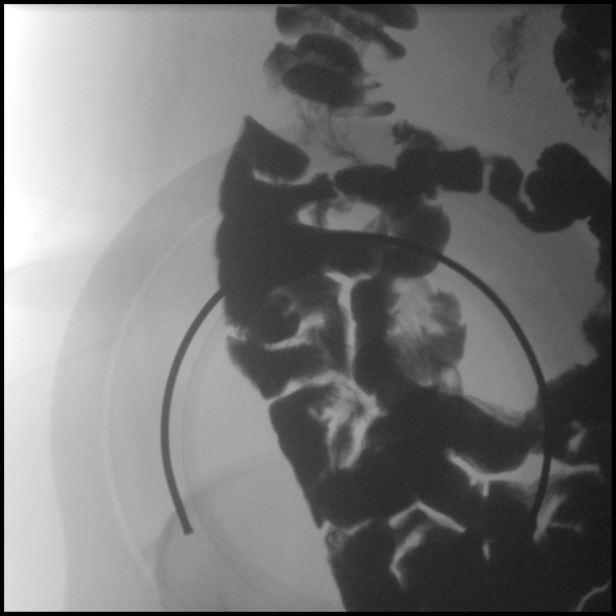

[cp_standard (7 of 10)]
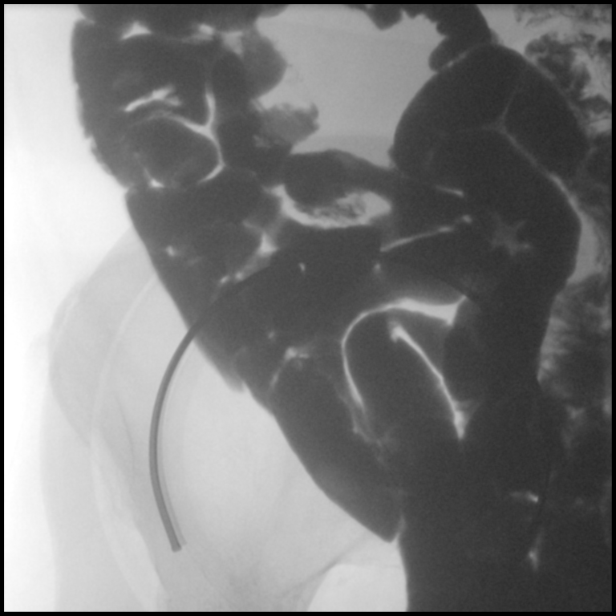

[cp_standard (8 of 10)]
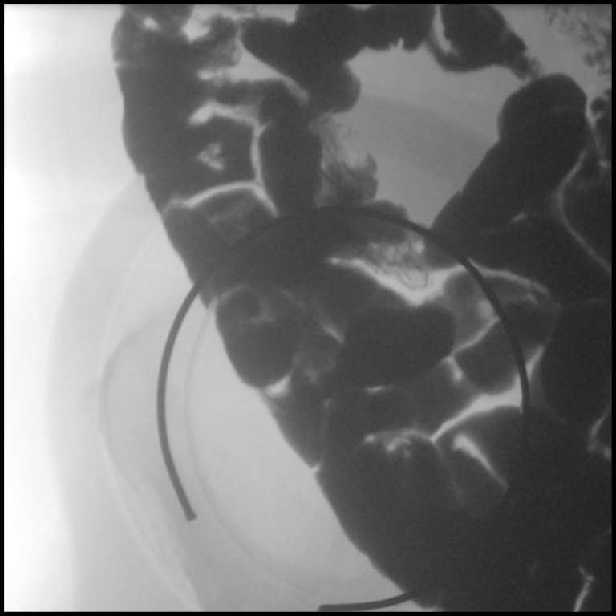

[cp_standard (9 of 10)]
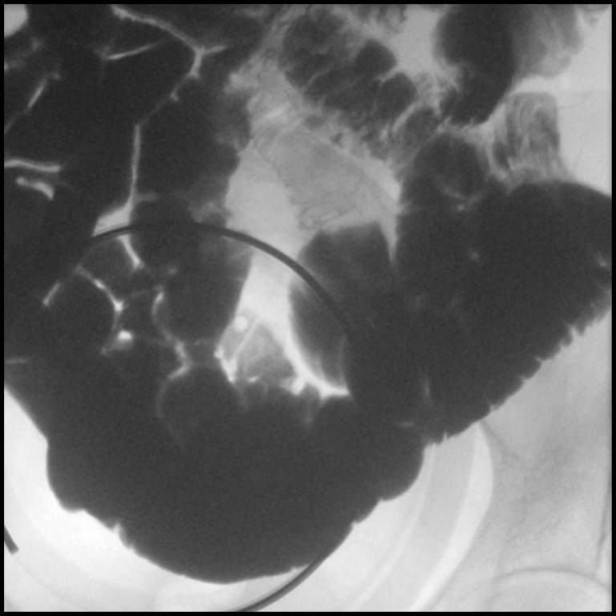

[cp_standard (10 of 10)]
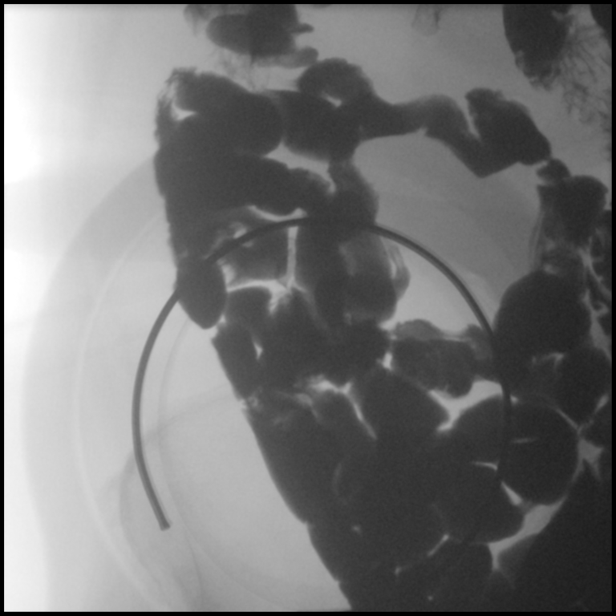

[14 of 20 positions shown; findings below may reference images not displayed]

FINDINGS: Normal bowel gas pattern on scout image.

No bowel dilatation or bowel wall thickening.

Surgical clips RIGHT upper quadrant question cholecystectomy.

Bones demineralized with degenerative disc and facet disease changes
lumbar spine associated with mild levoconvex lumbar scoliosis.

Normal transit of contrast from stomach to colon at 2 hours.

No gastric outlet obstruction.

Duodenal normal appearance with incidental note of a moderate-sized
duodenal diverticulum.

Jejunal and ileal loops are normal in appearance with normal mucosal
fold patterns.

No areas of dilatation, wall thickening, stricture, or obstruction.

A filling defect is seen at the ileum on the 60 minutes image in the
RIGHT mid abdomen, but is not definitely visualized on subsequent
images.

Focal compression views of small bowel loops are grossly normal.

No definite persistent intraluminal filling defects identified.
IMPRESSION: Questionable filling defect at an ileal loop in the RIGHT mid
abdomen at 60 minutes but not definitely visualized on subsequent
images, uncertain if potentially represents a contraction or
artifact but small bowel mass lesion not completely excluded;
recommend further assessment by CT enterography.

Remainder of exam unremarkable.

## 2019-04-12 ENCOUNTER — Ambulatory Visit (INDEPENDENT_AMBULATORY_CARE_PROVIDER_SITE_OTHER): Payer: Medicare Other | Admitting: Internal Medicine

## 2019-04-12 ENCOUNTER — Other Ambulatory Visit: Payer: Self-pay

## 2019-04-12 VITALS — Ht 65.0 in | Wt 165.0 lb

## 2019-04-12 DIAGNOSIS — R109 Unspecified abdominal pain: Secondary | ICD-10-CM

## 2019-04-12 DIAGNOSIS — K580 Irritable bowel syndrome with diarrhea: Secondary | ICD-10-CM

## 2019-04-12 MED ORDER — HYOSCYAMINE SULFATE 0.125 MG SL SUBL: 0.1250 mg | SUBLINGUAL_TABLET | Freq: Four times a day (QID) | SUBLINGUAL | 3 refills | Status: DC | PRN

## 2019-04-12 NOTE — Progress Notes (Signed)
Virtual Visit via Telephone Note  Patient had office visit scheduled today which was canceled because of ongoing COVID-19 pandemic.  Patient requested tele-visit and was decided to proceed with it. I connected with Suzanne Combs on 04/12/19 at 11:00 AM EDT by telephone and verified that I am speaking with the correct person using two identifiers.   I discussed the limitations, risks, security and privacy concerns of performing an evaluation and management service by telephone and the availability of in person appointments. I also discussed with the patient that there may be a patient responsible charge related to this service. The patient expressed understanding and agreed to proceed.   History of Present Illness:  Patient is 73 year old Caucasian female who has chronic diarrhea and has undergone extensive work-up and final diagnosis was irritable bowel syndrome.  She was last seen 1 year ago when she was doing well.  She has been using Levbid and loperamide.  Patient states lately she has not been doing well.  She has been having abdominal pain.  She has pain in right mid abdomen which radiates towards the rib cage.  She describes his pain to be sharp pain.  When this pain started few weeks ago she thought she had a kidney infection but she was seen by her physician and infection was ruled out.  She has had this pain for the past few weeks.  She also complains of pain in left lower quadrant of her abdomen which is also described to be sharp and radiates upwards this pain like other pain does not occur daily.  These pains seem to occur at different times.  She has been using hyoscyamine on as-needed basis.  She feels it is not helping.  She feels sublingual preparation worked better.  She also has nausea but no vomiting.  She is taking Imodium may be 10 doses in a month.  If she takes it daily she becomes constipated.  She has gained 8 pounds since her last visit 1 year ago.  She states on her scale  her weight ranges between 157 and 65 pounds. She has not experienced hematuria dysuria or vaginal bleeding.   Observations/Objective:  Patient reported her weight to be 165 pounds.  Assessment and Plan:  #1.  Chronic diarrhea felt to be IBS.  She is doing better with loperamide and as needed Levbid but now she is having abdominal pain.  #2.  Abdominal pain.  She is having bilateral abdominal pain.  She has pain on the right side as well as left side which occurs at different times.  I suspect left-sided pain may be due to IBS and right-sided pain may be musculoskeletal referred pain from her back.  Follow Up Instructions:  Discontinue Levbid. Begin hyoscyamine sublingual 0.125 mg 4 times daily as needed.  Patient will monitor her abdominal pain and call with progress report in 2 weeks. Office visit on PRN basis or 1 year. I discussed the assessment and treatment plan with the patient. The patient was provided an opportunity to ask questions and all were answered. The patient agreed with the plan and demonstrated an understanding of the instructions.   The patient was advised to call back or seek an in-person evaluation if the symptoms worsen or if the condition fails to improve as anticipated.  I provided 12 minutes of non-face-to-face time during this encounter.   Lionel December, MD

## 2019-04-12 NOTE — Patient Instructions (Signed)
Discontinue levbid. Begin hyoscyamine sublingual 1 tablet up to 4 times a day as needed. Office with progress report in 2 weeks.

## 2019-04-13 ENCOUNTER — Encounter (INDEPENDENT_AMBULATORY_CARE_PROVIDER_SITE_OTHER): Payer: Self-pay | Admitting: Internal Medicine

## 2019-05-17 DIAGNOSIS — Z7189 Other specified counseling: Secondary | ICD-10-CM | POA: Diagnosis not present

## 2019-05-17 DIAGNOSIS — E7800 Pure hypercholesterolemia, unspecified: Secondary | ICD-10-CM | POA: Diagnosis not present

## 2019-05-17 DIAGNOSIS — Z79899 Other long term (current) drug therapy: Secondary | ICD-10-CM | POA: Diagnosis not present

## 2019-05-17 DIAGNOSIS — E559 Vitamin D deficiency, unspecified: Secondary | ICD-10-CM | POA: Diagnosis not present

## 2019-05-17 DIAGNOSIS — J441 Chronic obstructive pulmonary disease with (acute) exacerbation: Secondary | ICD-10-CM | POA: Diagnosis not present

## 2019-05-17 DIAGNOSIS — Z1211 Encounter for screening for malignant neoplasm of colon: Secondary | ICD-10-CM | POA: Diagnosis not present

## 2019-05-17 DIAGNOSIS — Z6827 Body mass index (BMI) 27.0-27.9, adult: Secondary | ICD-10-CM | POA: Diagnosis not present

## 2019-05-17 DIAGNOSIS — Z1331 Encounter for screening for depression: Secondary | ICD-10-CM | POA: Diagnosis not present

## 2019-05-17 DIAGNOSIS — R5383 Other fatigue: Secondary | ICD-10-CM | POA: Diagnosis not present

## 2019-05-17 DIAGNOSIS — Z1339 Encounter for screening examination for other mental health and behavioral disorders: Secondary | ICD-10-CM | POA: Diagnosis not present

## 2019-05-17 DIAGNOSIS — Z0000 Encounter for general adult medical examination without abnormal findings: Secondary | ICD-10-CM | POA: Diagnosis not present

## 2019-05-17 DIAGNOSIS — Z299 Encounter for prophylactic measures, unspecified: Secondary | ICD-10-CM | POA: Diagnosis not present

## 2019-06-21 DIAGNOSIS — H524 Presbyopia: Secondary | ICD-10-CM | POA: Diagnosis not present

## 2019-06-21 DIAGNOSIS — H40053 Ocular hypertension, bilateral: Secondary | ICD-10-CM | POA: Diagnosis not present

## 2019-09-23 DIAGNOSIS — Z1231 Encounter for screening mammogram for malignant neoplasm of breast: Secondary | ICD-10-CM | POA: Diagnosis not present

## 2019-10-10 DIAGNOSIS — Z299 Encounter for prophylactic measures, unspecified: Secondary | ICD-10-CM | POA: Diagnosis not present

## 2019-10-10 DIAGNOSIS — K589 Irritable bowel syndrome without diarrhea: Secondary | ICD-10-CM | POA: Diagnosis not present

## 2019-10-10 DIAGNOSIS — J019 Acute sinusitis, unspecified: Secondary | ICD-10-CM | POA: Diagnosis not present

## 2019-10-10 DIAGNOSIS — J449 Chronic obstructive pulmonary disease, unspecified: Secondary | ICD-10-CM | POA: Diagnosis not present

## 2019-10-10 DIAGNOSIS — F17210 Nicotine dependence, cigarettes, uncomplicated: Secondary | ICD-10-CM | POA: Diagnosis not present

## 2019-10-10 DIAGNOSIS — J441 Chronic obstructive pulmonary disease with (acute) exacerbation: Secondary | ICD-10-CM | POA: Diagnosis not present

## 2019-11-14 DIAGNOSIS — J449 Chronic obstructive pulmonary disease, unspecified: Secondary | ICD-10-CM | POA: Diagnosis not present

## 2019-11-14 DIAGNOSIS — F17210 Nicotine dependence, cigarettes, uncomplicated: Secondary | ICD-10-CM | POA: Diagnosis not present

## 2019-11-14 DIAGNOSIS — J069 Acute upper respiratory infection, unspecified: Secondary | ICD-10-CM | POA: Diagnosis not present

## 2019-11-14 DIAGNOSIS — Z299 Encounter for prophylactic measures, unspecified: Secondary | ICD-10-CM | POA: Diagnosis not present

## 2019-11-14 DIAGNOSIS — J441 Chronic obstructive pulmonary disease with (acute) exacerbation: Secondary | ICD-10-CM | POA: Diagnosis not present

## 2019-12-26 ENCOUNTER — Ambulatory Visit (INDEPENDENT_AMBULATORY_CARE_PROVIDER_SITE_OTHER): Payer: Medicare Other | Admitting: Gastroenterology

## 2019-12-26 ENCOUNTER — Other Ambulatory Visit: Payer: Self-pay

## 2019-12-26 ENCOUNTER — Encounter (INDEPENDENT_AMBULATORY_CARE_PROVIDER_SITE_OTHER): Payer: Self-pay | Admitting: Gastroenterology

## 2019-12-26 VITALS — BP 150/88 | HR 74 | Temp 97.5°F | Ht 65.0 in | Wt 163.6 lb

## 2019-12-26 DIAGNOSIS — R197 Diarrhea, unspecified: Secondary | ICD-10-CM | POA: Diagnosis not present

## 2019-12-26 NOTE — Patient Instructions (Signed)
We are checking labs today for infection and your electrolytes, kidney function, etc.  You can increase to a full dose of Imodium in the afternoon-this may help decrease your nighttime diarrhea.  Limit caffeine.  If labs are unremarkable we will schedule colonoscopy for evaluation

## 2019-12-26 NOTE — Progress Notes (Signed)
Patient profile: Suzanne Combs is a 74 y.o. female seen for evaluation of diarrhea.   History of Present Illness: Suzanne Combs is seen today for diarrhea, she has a history of chronic diarrhea intermittent since 2011.  She feels that over the past 3 weeks her symptoms have been significantly worse than her baseline.  She is having 6-10 stools a day including nocturnal diarrhea some mornings around 2 AM.  She reports liquid stools and also having issues with incontinence due to urgency.  She has tried Pepto-Bismol which did not help, Levsin and dicyclomine without improvement.  Imodium helps some and she is taking 1 Imodium in the morning and a half in the afternoon.  She not found any clear food triggers.  She is also had some "excruciating" abdominal pain in lower abdomen.  Abdominal pain does improve somewhat she passes a stool.  She has had some nausea but no vomiting with the symptoms.  Overall feels she is eating half the amount she used to and feels full early.  She has had one episode of small amount of rectal bleeding felt related to hemorrhoids. No new meds at onset.   Chronic GERD symptoms well controlled on Pepcid.  Some nausea with above flare of diarrhea.  No vomiting or epigastric pain.  No dysphagia.  She does not drink alcohol.  She smokes 1 pack a day.  She has had taken some ibuprofen for her back flares a few times a week.  She was on a course of antibiotics in October for sinusitis.  Wt Readings from Last 3 Encounters:  12/26/19 163 lb 9.6 oz (74.2 kg)  04/12/19 165 lb (74.8 kg)  04/06/18 157 lb 8 oz (71.4 kg)     Last Colonoscopy: Colonoscopy 11/2010-small polyp rectum and sigmoid, path hyperplastic, no adenomatous polyps.  2016-Impression:  Normal mucosa of terminal ileum. Mild left-sided colonic diverticulosis. No evidence of colonic polyps are endoscopic colitis. Small external hemorrhoids    Past Medical History:  Past Medical History:  Diagnosis Date  .  Arthritis    back, neck, and fingers  . Chronic kidney disease    Left ureter obstruction  . Complication of anesthesia   . GERD (gastroesophageal reflux disease)   . History of hiatal hernia   . Neuromuscular disorder (HCC)    bulging disc in neck  . PONV (postoperative nausea and vomiting)     Problem List: There are no problems to display for this patient.   Past Surgical History: Past Surgical History:  Procedure Laterality Date  . CHOLECYSTECTOMY    . COLONOSCOPY N/A 06/28/2015   Procedure: COLONOSCOPY;  Surgeon: Malissa Hippo, MD;  Location: [facility];  Service: Endoscopy;  Laterality: N/A;  1100  . EYE SURGERY Bilateral 2002   cataract  . TONSILLECTOMY      Allergies: Allergies  Allergen Reactions  . Dicyclomine Other (See Comments)    Severe dry mouth  . Belladonna Alkaloids     Per Patient is caused bad headaches.  . Ciprocin-Fluocin-Procin  [Fluocinolone]   . Myrbetriq [Mirabegron]   . Prednisone Other (See Comments)    Can not tablets(Pain,headache)  . Quinolones Other (See Comments)    Affects joints  . Vesicare [Solifenacin]     Increased BP and heart rate      Home Medications:  Current Outpatient Medications:  .  Ascorbic Acid (VITAMIN C) 1000 MG tablet, Take 1,000 mg by mouth daily., Disp: , Rfl:  .  Cholecalciferol (VITAMIN  D PO), Take 50,000 mcg by mouth once a week. , Disp: , Rfl:  .  famotidine (PEPCID AC MAXIMUM STRENGTH) 20 MG tablet, Take 20 mg by mouth daily., Disp: , Rfl:  .  loperamide (IMODIUM A-D) 2 MG tablet, Take 0.5 tablets (1 mg total) by mouth daily., Disp: 30 tablet, Rfl: 0 .  hyoscyamine (LEVSIN SL) 0.125 MG SL tablet, Place 1 tablet (0.125 mg total) under the tongue every 6 (six) hours as needed. (Patient not taking: Reported on 12/26/2019), Disp: 12 tablet, Rfl: 3 .  levocetirizine (XYZAL) 5 MG tablet, Take 5 mg by mouth daily with lunch. Patient is not taking, Disp: , Rfl:    Family History: family history includes  Arthritis in her father and mother; Colon cancer (age of onset: 55) in her cousin; Colon cancer (age of onset: 51) in her paternal uncle; Dementia in her mother; Diverticulosis in her sister; Glaucoma in her sister; Hypertension in her brother, father, mother, and sister; Prostate cancer in her father; Stroke in her mother.    She reports her mother and sister have colon polyps.  Her father's brother and her father's brother son both had colon cancer.  Social History:   reports that she has been smoking cigarettes. She has a 40.00 pack-year smoking history. She has never used smokeless tobacco. She reports current alcohol use. She reports that she does not use drugs.   Review of Systems: Constitutional: Denies weight loss/weight gain  Eyes: No changes in vision. ENT: No oral lesions, sore throat.  GI: see HPI.  Heme/Lymph: No easy bruising.  CV: No chest pain.  GU: No hematuria.  Integumentary: No rashes.  Neuro: No headaches.  Psych: No depression/anxiety.  Endocrine: No heat/cold intolerance.  Allergic/Immunologic: No urticaria.  Resp: No cough, SOB.  Musculoskeletal: No joint swelling.    Physical Examination: BP (!) 150/88 (BP Location: Right Arm, Patient Position: Sitting, Cuff Size: Large)   Pulse 74   Temp (!) 97.5 F (36.4 C) (Temporal)   Ht 5\' 5"  (1.651 m)   Wt 163 lb 9.6 oz (74.2 kg)   BMI 27.22 kg/m  Gen: NAD, alert and oriented x 4 HEENT: PEERLA, EOMI, Neck: supple, no JVD Chest: CTA bilaterally, no wheezes, crackles, or other adventitious sounds CV: RRR, no m/g/c/r Abd: soft, NT, ND, +BS in all four quadrants; no HSM, guarding, ridigity, or rebound tenderness Ext: no edema, well perfused with 2+ pulses, Skin: no rash or lesions noted on observed skin Lymph: no noted LAD  Data Reviewed:     Assessment/Plan: Ms. Gatton is a 74 y.o. female    Anely was seen today for follow-up.  Diagnoses and all orders for this visit:  Diarrhea, unspecified type -      CBC -     CMP -     Clostridium difficile Toxin B, Qualitative, Real-Time PCR([facility]) -     Cancel: Gastrointestinal Panel by PCR , Stool     Recommendations: 1.  Diarrhea-acute onset with recent antibiotic use need to rule out infectious etiology, will also check labs to ensure she is not dehydrated.  If above studies negative she will need a colonoscopy for further evaluation with random biopsies.  She has tried Levsin & dicyclomine without improvement.  Did review she can increase her Imodium as she is only taking 1.5 tablets daily.  Diet modifications reviewed.  Further recommendations pending.  2.  Family history of colon polyps in her mother and sister-due for repeat 06/2020, will repeat early if  above studies negative.  Patient denies CP, SOB, and use of blood thinners. I discussed the risks and benefits of procedure including bleeding, perforation, infection, missed lesions, medication reactions and possible hospitalization or surgery if complications. All questions answered.   I personally performed the service, non-incident to. (WP)  Tawni Pummel, [facility]

## 2019-12-27 DIAGNOSIS — R197 Diarrhea, unspecified: Secondary | ICD-10-CM | POA: Diagnosis not present

## 2019-12-27 LAB — COMPLETE METABOLIC PANEL WITH GFR
AG Ratio: 1.6 (calc) (ref 1.0–2.5)
ALT: 13 U/L (ref 6–29)
AST: 13 U/L (ref 10–35)
Albumin: 3.9 g/dL (ref 3.6–5.1)
Alkaline phosphatase (APISO): 83 U/L (ref 37–153)
BUN: 9 mg/dL (ref 7–25)
CO2: 28 mmol/L (ref 20–32)
Calcium: 9.1 mg/dL (ref 8.6–10.4)
Chloride: 97 mmol/L — ABNORMAL LOW (ref 98–110)
Creat: 0.85 mg/dL (ref 0.60–0.93)
GFR, Est African American: 78 mL/min/{1.73_m2} (ref >=60)
GFR, Est Non African American: 67 mL/min/{1.73_m2} (ref >=60)
Globulin: 2.5 g/dL (calc) (ref 1.9–3.7)
Glucose, Bld: 94 mg/dL (ref 65–139)
Potassium: 4.3 mmol/L (ref 3.5–5.3)
Sodium: 133 mmol/L — ABNORMAL LOW (ref 135–146)
Total Bilirubin: 0.5 mg/dL (ref 0.2–1.2)
Total Protein: 6.4 g/dL (ref 6.1–8.1)

## 2019-12-27 LAB — CBC
HCT: 39 % (ref 35.0–45.0)
Hemoglobin: 13.7 g/dL (ref 11.7–15.5)
MCH: 31.2 pg (ref 27.0–33.0)
MCHC: 35.1 g/dL (ref 32.0–36.0)
MCV: 88.8 fL (ref 80.0–100.0)
MPV: 8.7 fL (ref 7.5–12.5)
Platelets: 474 10*3/uL — ABNORMAL HIGH (ref 140–400)
RBC: 4.39 10*6/uL (ref 3.80–5.10)
RDW: 12.3 % (ref 11.0–15.0)
WBC: 11.1 10*3/uL — ABNORMAL HIGH (ref 3.8–10.8)

## 2019-12-28 NOTE — Addendum Note (Signed)
Addended by: Malissa Hippo on: 12/28/2019 10:51 PM   Modules accepted: Orders

## 2019-12-29 LAB — GASTROINTESTINAL PATHOGEN PANEL PCR
C. difficile Tox A/B, PCR: NOT DETECTED
Campylobacter, PCR: NOT DETECTED
Cryptosporidium, PCR: NOT DETECTED
E coli (ETEC) LT/ST PCR: NOT DETECTED
E coli (STEC) stx1/stx2, PCR: NOT DETECTED
E coli 0157, PCR: NOT DETECTED
Giardia lamblia, PCR: NOT DETECTED
Norovirus, PCR: NOT DETECTED
Rotavirus A, PCR: NOT DETECTED
Salmonella, PCR: NOT DETECTED
Shigella, PCR: NOT DETECTED

## 2019-12-29 LAB — CLOSTRIDIUM DIFFICILE TOXIN B, QUALITATIVE, REAL-TIME PCR: Toxigenic C. Difficile by PCR: NOT DETECTED

## 2019-12-30 ENCOUNTER — Telehealth (INDEPENDENT_AMBULATORY_CARE_PROVIDER_SITE_OTHER): Payer: Self-pay | Admitting: Gastroenterology

## 2019-12-30 MED ORDER — ONDANSETRON 4 MG PO TBDP: 4.0000 mg | ORAL_TABLET | Freq: Three times a day (TID) | ORAL | 0 refills | Status: DC | PRN

## 2019-12-30 NOTE — Telephone Encounter (Signed)
Will send zofran to use prn for nausea per result note request.

## 2019-12-30 NOTE — Telephone Encounter (Signed)
Patient called and made aware.

## 2020-01-02 ENCOUNTER — Telehealth (INDEPENDENT_AMBULATORY_CARE_PROVIDER_SITE_OTHER): Payer: Self-pay | Admitting: Gastroenterology

## 2020-01-02 NOTE — Telephone Encounter (Signed)
Suzanne Combs - would like to see this patient in abt 4 weeks for follow up - okay for telephone only visit if she would like to avoid coming into office. Please arrange, thanks.

## 2020-01-04 ENCOUNTER — Ambulatory Visit (INDEPENDENT_AMBULATORY_CARE_PROVIDER_SITE_OTHER): Payer: Medicare Other | Admitting: Gastroenterology

## 2020-01-13 ENCOUNTER — Telehealth (INDEPENDENT_AMBULATORY_CARE_PROVIDER_SITE_OTHER): Payer: Self-pay | Admitting: Gastroenterology

## 2020-01-13 NOTE — Telephone Encounter (Signed)
Patient left message stating she is having a terrible time with diarrhea - thinks she may have an infection in her stomach - please advise - [phone]

## 2020-01-13 NOTE — Telephone Encounter (Signed)
Please notify pt we checked for infection in stool and was negative earlier in Jan. Next step in evaluation of diarrhea is colonoscopy, previously she said she did not want to do colonoscopy but I would recommend if still having symptoms.

## 2020-01-17 ENCOUNTER — Telehealth (INDEPENDENT_AMBULATORY_CARE_PROVIDER_SITE_OTHER): Payer: Self-pay | Admitting: Internal Medicine

## 2020-01-17 NOTE — Telephone Encounter (Signed)
Patient called stated she is having diarrhea really bad - Suzanne Combs had suggested a colonoscopy but she doesn't want to have it done because of COVID - please ask Dr Karilyn Cota for suggestions and advise - ph# [phone]

## 2020-01-18 NOTE — Telephone Encounter (Signed)
Per Dr.Rehman the patient should take the Levbid 0.375 mg - takes 1/2 every morning. She should also take in addition to the Imodium as she has been directed.  Patient was called and made aware and she states that she has some on hand , when a refill is needed she will have her pharmacy send [place] a request.

## 2020-01-20 DIAGNOSIS — Z23 Encounter for immunization: Secondary | ICD-10-CM | POA: Diagnosis not present

## 2020-01-30 ENCOUNTER — Encounter (INDEPENDENT_AMBULATORY_CARE_PROVIDER_SITE_OTHER): Payer: Self-pay | Admitting: Gastroenterology

## 2020-01-30 ENCOUNTER — Ambulatory Visit (INDEPENDENT_AMBULATORY_CARE_PROVIDER_SITE_OTHER): Payer: Medicare Other | Admitting: Gastroenterology

## 2020-01-30 ENCOUNTER — Other Ambulatory Visit: Payer: Self-pay

## 2020-01-30 VITALS — BP 151/89 | HR 73 | Temp 97.3°F | Ht 65.0 in | Wt 161.5 lb

## 2020-01-30 DIAGNOSIS — K219 Gastro-esophageal reflux disease without esophagitis: Secondary | ICD-10-CM | POA: Diagnosis not present

## 2020-01-30 DIAGNOSIS — D72829 Elevated white blood cell count, unspecified: Secondary | ICD-10-CM | POA: Diagnosis not present

## 2020-01-30 DIAGNOSIS — E871 Hypo-osmolality and hyponatremia: Secondary | ICD-10-CM | POA: Diagnosis not present

## 2020-01-30 DIAGNOSIS — R110 Nausea: Secondary | ICD-10-CM

## 2020-01-30 NOTE — Progress Notes (Signed)
Patient profile: Suzanne Combs is a 74 y.o. female seen for follow up of diarrhea.   History of Present Illness: Suzanne Combs is seen today for f/up of diarrhea. Last seen January 2021 for diarrhea. CBC, CMP, GI PCR and C diff all unremarkable except Na 133, WBC 11.1 and platelet 474. Has chronic diarrhea. Celiac panel negative in 2018.  She reports having continued issues since her last visit.  She tried either Levsin or Levbid half a tablet which reports caused severe dry mouth to the point she could not swallow.  Did decrease the diarrhea though.  She stopped it after 2 to 3 days and diarrhea has returned to her baseline which may be slightly less than at last visit but still prominent daily.  Reports having on average 4-5 bowel movements a day.  These began as a formed consistency and loose throughout the day.  Typically she takes a half an Imodium in the morning and uses a half dose after lunch, afternoon and night depending on symptom severity.  Reports she gets "episodes" where she becomes weak and clammy followed by diarrhea that is liquid.  This makes her feel weak throughout the rest the day.  She has some abdominal pain with episodes as well.  She denies any rectal bleeding.  No alternating constipation except when taking Levbid or Levsin.  She also gets occasional nausea at times with the symptoms.  We did provide Zofran at last visit and she states that this has drastically helped.  She is used a few times since her last visit.  Reports she has not had any NSAIDs since her last visit.  She does smoke 1 pack/day.  She denies alcohol use.  GERD symptoms are well controlled on her Pepcid 20 mg daily.  No dysphagia  She has received her first Covid vaccine and will receive her second Covid vaccine February 18, 2020  Wt Readings from Last 3 Encounters:  01/30/20 161 lb 8 oz (73.3 kg)  12/26/19 163 lb 9.6 oz (74.2 kg)  04/12/19 165 lb (74.8 kg)     Last Colonoscopy:   Last  Colonoscopy: Colonoscopy 11/2010-small polyp rectum and sigmoid, path hyperplastic, no adenomatous polyps.  2016-Impression:  Normal mucosa of terminal ileum. Mild left-sided colonic diverticulosis. No evidence of colonic polyps are endoscopic colitis. Small external hemorrhoids   Past Medical History:  Past Medical History:  Diagnosis Date  . Arthritis    back, neck, and fingers  . Chronic kidney disease    Left ureter obstruction  . Complication of anesthesia   . GERD (gastroesophageal reflux disease)   . History of hiatal hernia   . Neuromuscular disorder (HCC)    bulging disc in neck  . PONV (postoperative nausea and vomiting)     Problem List: There are no problems to display for this patient.   Past Surgical History: Past Surgical History:  Procedure Laterality Date  . CHOLECYSTECTOMY    . COLONOSCOPY N/A 06/28/2015   Procedure: COLONOSCOPY;  Surgeon: Malissa Hippo, MD;  Location: [facility];  Service: Endoscopy;  Laterality: N/A;  1100  . EYE SURGERY Bilateral 2002   cataract  . TONSILLECTOMY      Allergies: Allergies  Allergen Reactions  . Dicyclomine Other (See Comments)    Severe dry mouth  . Belladonna Alkaloids     Per Patient is caused bad headaches.  . Ciprocin-Fluocin-Procin  [Fluocinolone]   . Myrbetriq [Mirabegron]   . Prednisone Other (See Comments)  Can not tablets(Pain,headache)  . Quinolones Other (See Comments)    Affects joints  . Vesicare [Solifenacin]     Increased BP and heart rate      Home Medications:  Current Outpatient Medications:  .  Cholecalciferol (VITAMIN D PO), Take 50,000 mcg by mouth once a week. , Disp: , Rfl:  .  famotidine (PEPCID AC MAXIMUM STRENGTH) 20 MG tablet, Take 20 mg by mouth daily., Disp: , Rfl:  .  levocetirizine (XYZAL) 5 MG tablet, Take 5 mg by mouth daily with lunch. Patient is not taking, Disp: , Rfl:  .  loperamide (IMODIUM A-D) 2 MG tablet, Take 0.5 tablets (1 mg total) by mouth daily., Disp:  30 tablet, Rfl: 0 .  ondansetron (ZOFRAN ODT) 4 MG disintegrating tablet, Take 1 tablet (4 mg total) by mouth every 8 (eight) hours as needed for nausea or vomiting., Disp: 20 tablet, Rfl: 0 .  Ascorbic Acid (VITAMIN C) 1000 MG tablet, Take 1,000 mg by mouth daily., Disp: , Rfl:    Family History: family history includes Arthritis in her father and mother; Colon cancer (age of onset: 34) in her cousin; Colon cancer (age of onset: 34) in her paternal uncle; Dementia in her mother; Diverticulosis in her sister; Glaucoma in her sister; Hypertension in her brother, father, mother, and sister; Prostate cancer in her father; Stroke in her mother.    Social History:   reports that she has been smoking cigarettes. She has a 40.00 pack-year smoking history. She has never used smokeless tobacco. She reports current alcohol use. She reports that she does not use drugs.   Review of Systems: Constitutional: Denies weight loss/weight gain  Eyes: No changes in vision. ENT: No oral lesions, sore throat.  GI: see HPI.  Heme/Lymph: No easy bruising.  CV: No chest pain.  GU: No hematuria.  Integumentary: No rashes.  Neuro: No headaches.  Psych: No depression/anxiety.  Endocrine: No heat/cold intolerance.  Allergic/Immunologic: No urticaria.  Resp: No cough, SOB.  Musculoskeletal: No joint swelling.    Physical Examination: BP (!) 151/89 (BP Location: Right Arm, Patient Position: Sitting, Cuff Size: Large)   Pulse 73   Temp (!) 97.3 F (36.3 C) (Temporal)   Ht 5\' 5"  (1.651 m)   Wt 161 lb 8 oz (73.3 kg)   BMI 26.88 kg/m  Gen: NAD, alert and oriented x 4 HEENT: PEERLA, EOMI, Neck: supple, no JVD Chest: CTA bilaterally, no wheezes, crackles, or other adventitious sounds CV: RRR, no m/g/c/r Abd: soft, NT, ND, +BS in all four quadrants; no HSM, guarding, ridigity, or rebound tenderness Ext: no edema, well perfused with 2+ pulses, Skin: no rash or lesions noted on observed skin Lymph: no noted  LAD  Data Reviewed:  01/2017-IMPRESSION: upper GI Series  Questionable filling defect at an ileal loop in the RIGHT mid abdomen at 60 minutes but not definitely visualized on subsequent images, uncertain if potentially represents a contraction or artifact but small bowel mass lesion not completely excluded; recommend further assessment by CT enterography.  Remainder of exam unremarkable.  01/05/20-CBC with white count 9.1, platelet 474, CMP sodium 133, chloride 97, negative GI PCR and C. difficile  Assessment/Plan: Suzanne Combs is a 74 y.o. female   1.  Chronic diarrhea-acutely exacerbated at last visit.  Now back to slightly worse than her baseline chronic diarrhea which has been evaluated past and felt related to irritable bowel.  Had constipation & dry mouth with Levsin/Levbid (she is unsure which she took 1/2 dose)  she also has had severe dry mouth with dicyclomine.  She is currently controlling symptoms with Imodium. she is due for colonoscopy this year and will would recommend random biopsies to exclude microscopic colitis.  If unremarkable could consider trial of bile acid binder to help consistency as she reports a lot of liquid stools versus amitriptyline.  2.  GERD-fairly well controlled on Pepcid.  Does have some breakthrough nausea and is requesting endoscopy at time of colonoscopy.  Will arrange.  Has Zofran to use as needed.  Diet modifications reviewed.  3.  Leukocytosis-mildly elevated white blood cell count on last labs-we will repeat today.  Very minimal hyponatremia-also repeat CMP  Patient denies CP, SOB, and use of blood thinners. I discussed the risks and benefits of procedure including bleeding, perforation, infection, missed lesions, medication reactions and possible hospitalization or surgery if complications. All questions answered.  Denies prior issues with sedation   Order for EGD/colonoscopy to be placed in chart, patient wants to wait until after 2nd covid vaccines  in March 2021.  Suzanne Combs was seen today for follow-up.  Diagnoses and all orders for this visit:  Leukocytosis, unspecified type -     CBC with Differential  Hyponatremia -     Basic Metabolic Panel (BMET)  Gastroesophageal reflux disease, unspecified whether esophagitis present  Nausea without vomiting       I personally performed the service, non-incident to. (WP)  Suzanne Combs, [facility]

## 2020-01-30 NOTE — Patient Instructions (Addendum)
We will plan for endoscopy colonoscopy late march or early April after COVID vaccines.   Can continue imodium in interim.   We are rechecking labs today

## 2020-01-31 ENCOUNTER — Other Ambulatory Visit (INDEPENDENT_AMBULATORY_CARE_PROVIDER_SITE_OTHER): Payer: Self-pay | Admitting: *Deleted

## 2020-01-31 ENCOUNTER — Telehealth (INDEPENDENT_AMBULATORY_CARE_PROVIDER_SITE_OTHER): Payer: Self-pay | Admitting: *Deleted

## 2020-01-31 ENCOUNTER — Encounter (INDEPENDENT_AMBULATORY_CARE_PROVIDER_SITE_OTHER): Payer: Self-pay | Admitting: *Deleted

## 2020-01-31 DIAGNOSIS — K219 Gastro-esophageal reflux disease without esophagitis: Secondary | ICD-10-CM

## 2020-01-31 DIAGNOSIS — R197 Diarrhea, unspecified: Secondary | ICD-10-CM

## 2020-01-31 LAB — CBC WITH DIFFERENTIAL/PLATELET
Absolute Monocytes: 830 cells/uL (ref 200–950)
Basophils Absolute: 50 cells/uL (ref 0–200)
Basophils Relative: 0.5 %
Eosinophils Absolute: 60 cells/uL (ref 15–500)
Eosinophils Relative: 0.6 %
HCT: 40.5 % (ref 35.0–45.0)
Hemoglobin: 13.8 g/dL (ref 11.7–15.5)
Lymphs Abs: 2790 cells/uL (ref 850–3900)
MCH: 30.6 pg (ref 27.0–33.0)
MCHC: 34.1 g/dL (ref 32.0–36.0)
MCV: 89.8 fL (ref 80.0–100.0)
MPV: 8.8 fL (ref 7.5–12.5)
Monocytes Relative: 8.3 %
Neutro Abs: 6270 cells/uL (ref 1500–7800)
Neutrophils Relative %: 62.7 %
Platelets: 491 10*3/uL — ABNORMAL HIGH (ref 140–400)
RBC: 4.51 10*6/uL (ref 3.80–5.10)
RDW: 12.4 % (ref 11.0–15.0)
Total Lymphocyte: 27.9 %
WBC: 10 10*3/uL (ref 3.8–10.8)

## 2020-01-31 LAB — BASIC METABOLIC PANEL
BUN: 7 mg/dL (ref 7–25)
CO2: 29 mmol/L (ref 20–32)
Calcium: 9.4 mg/dL (ref 8.6–10.4)
Chloride: 97 mmol/L — ABNORMAL LOW (ref 98–110)
Creat: 0.63 mg/dL (ref 0.60–0.93)
Glucose, Bld: 89 mg/dL (ref 65–99)
Potassium: 4.4 mmol/L (ref 3.5–5.3)
Sodium: 134 mmol/L — ABNORMAL LOW (ref 135–146)

## 2020-01-31 NOTE — Telephone Encounter (Signed)
Patient needs  plenvu (copay card) TCS/EGD sch'd 4/8

## 2020-02-01 MED ORDER — PLENVU 140 G PO SOLR: 1.0000 | Freq: Once | ORAL | 0 refills | Status: AC

## 2020-02-18 DIAGNOSIS — Z23 Encounter for immunization: Secondary | ICD-10-CM | POA: Diagnosis not present

## 2020-03-20 ENCOUNTER — Other Ambulatory Visit (HOSPITAL_COMMUNITY)
Admission: RE | Admit: 2020-03-20 | Discharge: 2020-03-20 | Disposition: A | Discharge status: Home / Self Care | Payer: Medicare Other | Source: Ambulatory Visit | Attending: Internal Medicine | Admitting: Internal Medicine

## 2020-03-20 ENCOUNTER — Other Ambulatory Visit: Payer: Self-pay

## 2020-03-20 DIAGNOSIS — Z01812 Encounter for preprocedural laboratory examination: Secondary | ICD-10-CM | POA: Insufficient documentation

## 2020-03-20 DIAGNOSIS — Z20822 Contact with and (suspected) exposure to covid-19: Secondary | ICD-10-CM | POA: Diagnosis not present

## 2020-03-21 LAB — SARS CORONAVIRUS 2 (TAT 6-24 HRS): SARS Coronavirus 2: NEGATIVE

## 2020-03-22 ENCOUNTER — Ambulatory Visit (HOSPITAL_COMMUNITY)
Admission: RE | Admit: 2020-03-22 | Discharge: 2020-03-22 | Disposition: A | Discharge status: Home / Self Care | Payer: Medicare Other | Attending: Internal Medicine | Admitting: Internal Medicine

## 2020-03-22 ENCOUNTER — Encounter (HOSPITAL_COMMUNITY): Payer: Self-pay | Admitting: Internal Medicine

## 2020-03-22 ENCOUNTER — Other Ambulatory Visit: Payer: Self-pay

## 2020-03-22 ENCOUNTER — Encounter (HOSPITAL_COMMUNITY)
Admission: RE | Disposition: A | Discharge status: Home / Self Care | Payer: Self-pay | Source: Home / Self Care | Attending: Internal Medicine

## 2020-03-22 DIAGNOSIS — K2970 Gastritis, unspecified, without bleeding: Secondary | ICD-10-CM | POA: Diagnosis not present

## 2020-03-22 DIAGNOSIS — K228 Other specified diseases of esophagus: Secondary | ICD-10-CM | POA: Diagnosis not present

## 2020-03-22 DIAGNOSIS — F17210 Nicotine dependence, cigarettes, uncomplicated: Secondary | ICD-10-CM | POA: Diagnosis not present

## 2020-03-22 DIAGNOSIS — Z800 Family history of malignant neoplasm of digestive organs: Secondary | ICD-10-CM | POA: Diagnosis not present

## 2020-03-22 DIAGNOSIS — K644 Residual hemorrhoidal skin tags: Secondary | ICD-10-CM | POA: Diagnosis not present

## 2020-03-22 DIAGNOSIS — R197 Diarrhea, unspecified: Secondary | ICD-10-CM | POA: Diagnosis not present

## 2020-03-22 DIAGNOSIS — K219 Gastro-esophageal reflux disease without esophagitis: Secondary | ICD-10-CM | POA: Insufficient documentation

## 2020-03-22 DIAGNOSIS — K449 Diaphragmatic hernia without obstruction or gangrene: Secondary | ICD-10-CM | POA: Insufficient documentation

## 2020-03-22 DIAGNOSIS — K529 Noninfective gastroenteritis and colitis, unspecified: Secondary | ICD-10-CM | POA: Diagnosis not present

## 2020-03-22 DIAGNOSIS — K5730 Diverticulosis of large intestine without perforation or abscess without bleeding: Secondary | ICD-10-CM | POA: Diagnosis not present

## 2020-03-22 DIAGNOSIS — K2950 Unspecified chronic gastritis without bleeding: Secondary | ICD-10-CM | POA: Insufficient documentation

## 2020-03-22 DIAGNOSIS — Z888 Allergy status to other drugs, medicaments and biological substances status: Secondary | ICD-10-CM | POA: Diagnosis not present

## 2020-03-22 DIAGNOSIS — Z79899 Other long term (current) drug therapy: Secondary | ICD-10-CM | POA: Diagnosis not present

## 2020-03-22 DIAGNOSIS — N189 Chronic kidney disease, unspecified: Secondary | ICD-10-CM | POA: Diagnosis not present

## 2020-03-22 HISTORY — PX: ESOPHAGOGASTRODUODENOSCOPY: SHX5428

## 2020-03-22 HISTORY — PX: BIOPSY: SHX5522

## 2020-03-22 HISTORY — PX: COLONOSCOPY: SHX5424

## 2020-03-22 SURGERY — EGD (ESOPHAGOGASTRODUODENOSCOPY)
Anesthesia: Moderate Sedation

## 2020-03-22 MED ORDER — LIDOCAINE VISCOUS HCL 2 % MT SOLN
OROMUCOSAL | Status: DC | PRN
  Administered 2020-03-22: 4 mL via OROMUCOSAL

## 2020-03-22 MED ORDER — MEPERIDINE HCL 50 MG/ML IJ SOLN
INTRAMUSCULAR | Status: AC
  Filled 2020-03-22: qty 1

## 2020-03-22 MED ORDER — MIDAZOLAM HCL 5 MG/5ML IJ SOLN
INTRAMUSCULAR | Status: AC
  Filled 2020-03-22: qty 5

## 2020-03-22 MED ORDER — SODIUM CHLORIDE 0.9 % IV SOLN: INTRAVENOUS | Status: DC

## 2020-03-22 MED ORDER — LIDOCAINE VISCOUS HCL 2 % MT SOLN
OROMUCOSAL | Status: AC
  Filled 2020-03-22: qty 15

## 2020-03-22 MED ORDER — MIDAZOLAM HCL 5 MG/5ML IJ SOLN
INTRAMUSCULAR | Status: DC | PRN
  Administered 2020-03-22: 2 mg via INTRAVENOUS
  Administered 2020-03-22 (×2): 1 mg via INTRAVENOUS
  Administered 2020-03-22: 2 mg via INTRAVENOUS
  Administered 2020-03-22: 1 mg via INTRAVENOUS

## 2020-03-22 MED ORDER — MEPERIDINE HCL 50 MG/ML IJ SOLN
INTRAMUSCULAR | Status: DC | PRN
  Administered 2020-03-22 (×2): 25 mg via INTRAVENOUS

## 2020-03-22 MED ORDER — STERILE WATER FOR IRRIGATION IR SOLN
Status: DC | PRN
  Administered 2020-03-22: 09:00:00 200 mL

## 2020-03-22 MED ORDER — MIDAZOLAM HCL 5 MG/5ML IJ SOLN
INTRAMUSCULAR | Status: AC
  Filled 2020-03-22: qty 10

## 2020-03-22 NOTE — H&P (Signed)
Suzanne Combs is an 74 y.o. female.   Chief Complaint: Patient is here for esophagogastroduodenoscopy and colonoscopy. HPI: Patient is 74 year old Caucasian female who has chronic GERD as noted flareup recently.  She is on low-dose Pepcid with some relief.  She denies vomiting dysphagia or melena.  She has chronic diarrhea which has flared up lately.  She denies rectal bleeding.  I diarrhea is not associated weight loss.  She has been presumed to have IBS but has not responded well to therapy.  Her last colonoscopy was in July 2016.  She did not have any polyps or endoscopic colitis. Family history is positive for colon carcinoma in a paternal uncle who was in his 7s when he passed away.  He was possibly in his late 71s when he was diagnosed.  She lost first cousin also on father side of colon carcinoma in his 30s.  Past Medical History:  Diagnosis Date  . Arthritis    back, neck, and fingers  . Chronic kidney disease    Left ureter obstruction  . Complication of anesthesia   . GERD (gastroesophageal reflux disease)   . History of hiatal hernia   . Neuromuscular disorder (HCC)    bulging disc in neck  . PONV (postoperative nausea and vomiting)     Past Surgical History:  Procedure Laterality Date  . CHOLECYSTECTOMY    . COLONOSCOPY N/A 06/28/2015   Procedure: COLONOSCOPY;  Surgeon: Malissa Hippo, MD;  Location: [facility];  Service: Endoscopy;  Laterality: N/A;  1100  . EYE SURGERY Bilateral 2002   cataract  . TONSILLECTOMY      Family History  Problem Relation Age of Onset  . Stroke Mother   . Hypertension Mother   . Dementia Mother   . Arthritis Mother   . Hypertension Father   . Prostate cancer Father   . Arthritis Father   . Hypertension Sister   . Glaucoma Sister   . Diverticulosis Sister   . Hypertension Brother   . Colon cancer Cousin 37  . Colon cancer Paternal Uncle 3   Social History:  reports that she has been smoking cigarettes. She has a 40.00  pack-year smoking history. She has never used smokeless tobacco. She reports current alcohol use. She reports that she does not use drugs.  Allergies:  Allergies  Allergen Reactions  . Dicyclomine Other (See Comments)    Severe dry mouth  . Belladonna Alkaloids     Per Patient is caused bad headaches.  . Ciprocin-Fluocin-Procin [Fluocinolone]     Unknown reaction  . Myrbetriq [Mirabegron]     headaches  . Prednisone Other (See Comments)    Can not tablets(Pain,headache)  . Quinolones Other (See Comments)    Affects joints  . Vesicare [Solifenacin]     Increased BP and heart rate    Medications Prior to Admission  Medication Sig Dispense Refill  . Ascorbic Acid (VITAMIN C) 1000 MG tablet Take 1,000 mg by mouth daily at 12 noon.     . calcium carbonate (TUMS - DOSED IN MG ELEMENTAL CALCIUM) 500 MG chewable tablet Chew 1 tablet by mouth daily as needed for indigestion or heartburn.    . famotidine (PEPCID AC MAXIMUM STRENGTH) 20 MG tablet Take 20 mg by mouth daily at 12 noon.     Marland Kitchen ibuprofen (ADVIL) 200 MG tablet Take 200 mg by mouth every 8 (eight) hours as needed for moderate pain.    Marland Kitchen levocetirizine (XYZAL) 5 MG tablet Take 5  mg by mouth daily at 12 noon.     . loperamide (IMODIUM A-D) 2 MG tablet Take 0.5 tablets (1 mg total) by mouth daily. (Patient taking differently: Take 1 mg by mouth 3 (three) times daily as needed for diarrhea or loose stools. ) 30 tablet 0  . Polyvinyl Alcohol-Povidone (REFRESH OP) Place 1 drop into both eyes daily as needed (dry eyes).    . Vitamin D, Ergocalciferol, (DRISDOL) 1.25 MG (50000 UNIT) CAPS capsule Take 50,000 Units by mouth every Sunday.    . ondansetron (ZOFRAN ODT) 4 MG disintegrating tablet Take 1 tablet (4 mg total) by mouth every 8 (eight) hours as needed for nausea or vomiting. 20 tablet 0    No results found for this or any previous visit (from the past 48 hour(s)). No results found.  Review of Systems  Blood pressure (!) 143/109,  pulse 71, temperature 98.4 F (36.9 C), temperature source Oral, resp. rate 18, height 5\' 4"  (1.626 m), weight 73.5 kg, SpO2 100 %. Physical Exam  Constitutional: She appears well-developed and well-nourished.  HENT:  Mouth/Throat: Oropharynx is clear and moist.  Eyes: Conjunctivae are normal. No scleral icterus.  Neck: No thyromegaly present.  Cardiovascular: Normal rate, regular rhythm and normal heart sounds.  No murmur heard. Respiratory: Effort normal and breath sounds normal.  GI:  Abdomen is symmetrical and soft.  She has mild generalized tenderness.  No organomegaly or masses.  Musculoskeletal:        General: No edema.  Lymphadenopathy:    She has no cervical adenopathy.  Neurological: She is alert.  Skin: Skin is warm and dry.     Assessment/Plan Chronic GERD with recent flareup of symptoms. Chronic diarrhea. Family history of CRC in 2 second-degree relatives. Diagnostic esophagogastroduodenoscopy and colonoscopy.  Hildred Laser, MD 03/22/2020, 8:36 AM

## 2020-03-22 NOTE — Op Note (Signed)
[facility] Patient Name: Suzanne Combs Procedure Date: 03/22/2020 8:15 AM MRN: [id number] Date of Birth: 10-01-1946 Attending MD: Lionel DecemberNajeeb Destane Speas , MD CSN: [id number] Age: [id number] Admit Type: Outpatient Procedure:                Upper GI endoscopy Indications:              Follow-up of gastro-esophageal reflux disease Providers:                Lionel DecemberNajeeb Anjuli Gemmill, MD, Edrick Kinsammy Vaught, RN, Dyann Ruddleonya Wilson Referring MD:             Ignatius Speckinghruv B. Vyas, MD Medicines:                Lidocaine spray, Meperidine 50 mg IV, Midazolam 6                            mg IV Complications:            No immediate complications. Estimated Blood Loss:     Estimated blood loss was minimal. Procedure:                Pre-Anesthesia Assessment:                           - Prior to the procedure, a History and Physical                            was performed, and patient medications and                            allergies were reviewed. The patient's tolerance of                            previous anesthesia was also reviewed. The risks                            and benefits of the procedure and the sedation                            options and risks were discussed with the patient.                            All questions were answered, and informed consent                            was obtained. Prior Anticoagulants: The patient has                            taken no previous anticoagulant or antiplatelet                            agents except for NSAID medication. ASA Grade                            Assessment: II - A patient with mild systemic  disease. After reviewing the risks and benefits,                            the patient was deemed in satisfactory condition to                            undergo the procedure.                           After obtaining informed consent, the endoscope was                            passed under direct vision. Throughout the          procedure, the patient's blood pressure, pulse, and                            oxygen saturations were monitored continuously. The                            [id number]-H190 ([id number]) scope was introduced through the                            mouth, and advanced to the second part of duodenum.                            The upper GI endoscopy was accomplished without                            difficulty. The patient tolerated the procedure                            well. Scope In: 8:50:01 AM Scope Out: 8:58:34 AM Total Procedure Duration: 0 hours 8 minutes 33 seconds  Findings:      The examined esophagus was normal.      The Z-line was irregular and was found 38 cm from the incisors.      A 2 cm hiatal hernia was present.      Localized mild inflammation characterized by erosions and erythema was       found in the prepyloric region of the stomach.      Petechiae was found in the gastric antrum. Biopsies were taken with a       cold forceps for histology. The pathology specimen was placed into       Bottle Number 1.      The exam of the stomach was otherwise normal.      The duodenal bulb and second portion of the duodenum were normal.       Biopsies for histology were taken with a cold forceps for evaluation of       celiac disease. The pathology specimen was placed into Bottle Number 2. Impression:               - Normal esophagus.                           - Z-line irregular, 38 cm from the incisors.                           -  2 cm hiatal hernia.                           - Gastritis.                           - Petechiah hemorrhage in the gastric antrum.                            Biopsied.                           - Normal duodenal bulb and second portion of the                            duodenum. Biopsied. Moderate Sedation:      Moderate (conscious) sedation was administered by the endoscopy nurse       and supervised by the endoscopist. The following parameters were        monitored: oxygen saturation, heart rate, blood pressure, CO2       capnography and response to care. Total physician intraservice time was       17 minutes. Recommendation:           - Patient has a contact number available for                            emergencies. The signs and symptoms of potential                            delayed complications were discussed with the                            patient. Return to normal activities tomorrow.                            Written discharge instructions were provided to the                            patient.                           - Resume previous diet today.                           - Continue present medications.                           - No aspirin, ibuprofen, naproxen, or other                            non-steroidal anti-inflammatory drugs for 1 day.                           - Await pathology results. Procedure Code(s):        --- Professional ---  09628, Esophagogastroduodenoscopy, flexible,                            transoral; with biopsy, single or multiple                           G0500, Moderate sedation services provided by the                            same physician or other qualified health care                            professional performing a gastrointestinal                            endoscopic service that sedation supports,                            requiring the presence of an independent trained                            observer to assist in the monitoring of the                            patient's level of consciousness and physiological                            status; initial 15 minutes of intra-service time;                            patient age 18 years or older (additional time may                            be reported with [phone], as appropriate) Diagnosis Code(s):        --- Professional ---                           K22.8, Other specified diseases of  esophagus                           K44.9, Diaphragmatic hernia without obstruction or                            gangrene                           K29.70, Gastritis, unspecified, without bleeding                           K92.2, Gastrointestinal hemorrhage, unspecified                           K21.9, Gastro-esophageal reflux disease without                            esophagitis CPT copyright 2019 [employer]  [employer]. All rights reserved. The codes documented in this report are preliminary and upon coder review may  be revised to meet current compliance requirements. Lionel December, MD Lionel December, MD 03/22/2020 9:34:41 AM This report has been signed electronically. Number of Addenda: 0

## 2020-03-22 NOTE — Discharge Instructions (Signed)
No aspirin or NSAIDs for 24 hours. Resume usual medications as before. Resume usual diet. No driving for 24 hours. Physician will call with biopsy results.   Diverticulosis  Diverticulosis is a condition that develops when small pouches (diverticula) form in the wall of the large intestine (colon). The colon is where water is absorbed and stool (feces) is formed. The pouches form when the inside layer of the colon pushes through weak spots in the outer layers of the colon. You may have a few pouches or many of them. The pouches usually do not cause problems unless they become inflamed or infected. When this happens, the condition is called diverticulitis. What are the causes? The cause of this condition is not known. What increases the risk? The following factors may make you more likely to develop this condition:  Being older than age 61. Your risk for this condition increases with age. Diverticulosis is rare among people younger than age 60. By age 42, many people have it.  Eating a low-fiber diet.  Having frequent constipation.  Being overweight.  Not getting enough exercise.  Smoking.  Taking over-the-counter pain medicines, like aspirin and ibuprofen.  Having a family history of diverticulosis. What are the signs or symptoms? In most people, there are no symptoms of this condition. If you do have symptoms, they may include:  Bloating.  Cramps in the abdomen.  Constipation or diarrhea.  Pain in the lower left side of the abdomen. How is this diagnosed? Because diverticulosis usually has no symptoms, it is most often diagnosed during an exam for other colon problems. The condition may be diagnosed by:  Using a flexible scope to examine the colon (colonoscopy).  Taking an X-ray of the colon after dye has been put into the colon (barium enema).  Having a CT scan. How is this treated? You may not need treatment for this condition. Your health care provider may  recommend treatment to prevent problems. You may need treatment if you have symptoms or if you previously had diverticulitis. Treatment may include:  Eating a high-fiber diet.  Taking a fiber supplement.  Taking a live bacteria supplement (probiotic).  Taking medicine to relax your colon. Follow these instructions at home: Medicines  Take over-the-counter and prescription medicines only as told by your health care provider.  If told by your health care provider, take a fiber supplement or probiotic. Constipation prevention Your condition may cause constipation. To prevent or treat constipation, you may need to:  Drink enough fluid to keep your urine pale yellow.  Take over-the-counter or prescription medicines.  Eat foods that are high in fiber, such as beans, whole grains, and fresh fruits and vegetables.  Limit foods that are high in fat and processed sugars, such as fried or sweet foods.  General instructions  Try not to strain when you have a bowel movement.  Keep all follow-up visits as told by your health care provider. This is important. Contact a health care provider if you:  Have pain in your abdomen.  Have bloating.  Have cramps.  Have not had a bowel movement in 3 days. Get help right away if:  Your pain gets worse.  Your bloating becomes very bad.  You have a fever or chills, and your symptoms suddenly get worse.  You vomit.  You have bowel movements that are bloody or black.  You have bleeding from your rectum. Summary  Diverticulosis is a condition that develops when small pouches (diverticula) form in the wall of  the large intestine (colon).  You may have a few pouches or many of them.  This condition is most often diagnosed during an exam for other colon problems.  Treatment may include increasing the fiber in your diet, taking supplements, or taking medicines. This information is not intended to replace advice given to you by your health  care provider. Make sure you discuss any questions you have with your health care provider. Document Revised: 06/30/2019 Document Reviewed: 06/30/2019 Elsevier Patient Education  2020 [employer] Inc.  Hernia, Adult     A hernia happens when tissue inside your body pushes out through a weak spot in your belly muscles (abdominal wall). This makes a round lump (bulge). The lump may be:  In a scar from surgery that was done in your belly (incisional hernia).  Near your belly button (umbilical hernia).  In your groin (inguinal hernia). Your groin is the area where your leg meets your lower belly (abdomen). This kind of hernia could also be: ? In your scrotum, if you are female. ? In folds of skin around your vagina, if you are female.  In your upper thigh (femoral hernia).  Inside your belly (hiatal hernia). This happens when your stomach slides above the muscle between your belly and your chest (diaphragm). If your hernia is small and it does not cause pain, you may not need treatment. If your hernia is large or it causes pain, you may need surgery. Follow these instructions at home: Activity  Avoid stretching or overusing (straining) the muscles near your hernia. Straining can happen when you: ? Lift something heavy. ? Poop (have a bowel movement).  Do not lift anything that is heavier than 10 lb (4.5 kg), or the limit that you are told, until your doctor says that it is safe.  Use the strength of your legs when you lift something heavy. Do not use only your back muscles to lift. General instructions  Do these things if told by your doctor so you do not have trouble pooping (constipation): ? Drink enough fluid to keep your pee (urine) pale yellow. ? Eat foods that are high in fiber. These include fresh fruits and vegetables, whole grains, and beans. ? Limit foods that are high in fat and processed sugars. These include foods that are fried or sweet. ? Take medicine for trouble  pooping.  When you cough, try to cough gently.  You may try to push your hernia in by very gently pressing on it when you are lying down. Do not try to force the bulge back in if it will not push in easily.  If you are overweight, work with your doctor to lose weight safely.  Do not use any products that have nicotine or tobacco in them. These include cigarettes and e-cigarettes. If you need help quitting, ask your doctor.  If you will be having surgery (hernia repair), watch your hernia for changes in shape, size, or color. Tell your doctor if you see any changes.  Take over-the-counter and prescription medicines only as told by your doctor.  Keep all follow-up visits as told by your doctor. Contact a doctor if:  You get new pain, swelling, or redness near your hernia.  You poop fewer times in a week than normal.  You have trouble pooping.  You have poop (stool) that is more dry than normal.  You have poop that is harder or larger than normal. Get help right away if:  You have a fever.  You  have belly pain that gets worse.  You feel sick to your stomach (nauseous).  You throw up (vomit).  Your hernia cannot be pushed in by very gently pressing on it when you are lying down. Do not try to force the bulge back in if it will not push in easily.  Your hernia: ? Changes in shape or size. ? Changes color. ? Feels hard or it hurts when you touch it. These symptoms may represent a serious problem that is an emergency. Do not wait to see if the symptoms will go away. Get medical help right away. Call your local emergency services (911 in the [place] Summary  A hernia happens when tissue inside your body pushes out through a weak spot in the belly muscles. This creates a bulge.  If your hernia is small and it does not hurt, you may not need treatment. If your hernia is large or it hurts, you may need surgery.  If you will be having surgery, watch your hernia for changes in  shape, size, or color. Tell your doctor about any changes. This information is not intended to replace advice given to you by your health care provider. Make sure you discuss any questions you have with your health care provider. Document Revised: 03/24/2019 Document Reviewed: 09/02/2017 Elsevier Patient Education  [place].  Gastritis, Adult  Gastritis is swelling (inflammation) of the stomach. Gastritis can develop quickly (acute). It can also develop slowly over time (chronic). It is important to get help for this condition. If you do not get help, your stomach can bleed, and you can get sores (ulcers) in your stomach. What are the causes? This condition may be caused by:  Germs that get to your stomach.  Drinking too much alcohol.  Medicines you are taking.  Too much acid in the stomach.  A disease of the intestines or stomach.  Stress.  An allergic reaction.  Crohn's disease.  Some cancer treatments (radiation). Sometimes the cause of this condition is not known. What are the signs or symptoms? Symptoms of this condition include:  Pain in your stomach.  A burning feeling in your stomach.  Feeling sick to your stomach (nauseous).  Throwing up (vomiting).  Feeling too full after you eat.  Weight loss.  Bad breath.  Throwing up blood.  Blood in your poop (stool). How is this diagnosed? This condition may be diagnosed with:  Your medical history and symptoms.  A physical exam.  Tests. These can include: ? Blood tests. ? Stool tests. ? A procedure to look inside your stomach (upper endoscopy). ? A test in which a sample of tissue is taken for testing (biopsy). How is this treated? Treatment for this condition depends on what caused it. You may be given:  Antibiotic medicine, if your condition was caused by germs.  H2 blockers and similar medicines, if your condition was caused by too much acid. Follow these instructions at  home: Medicines  Take over-the-counter and prescription medicines only as told by your doctor.  If you were prescribed an antibiotic medicine, take it as told by your doctor. Do not stop taking it even if you start to feel better. Eating and drinking   Eat small meals often, instead of large meals.  Avoid foods and drinks that make your symptoms worse.  Drink enough fluid to keep your pee (urine) pale yellow. Alcohol use  Do not drink alcohol if: ? Your doctor tells you not to drink. ? You are pregnant,  may be pregnant, or are planning to become pregnant.  If you drink alcohol: ? Limit your use to:  0-1 drink a day for women.  0-2 drinks a day for men. ? Be aware of how much alcohol is in your drink. In the [place] one drink equals one 12 oz bottle of beer (355 mL), one 5 oz glass of wine (148 mL), or one 1 oz glass of hard liquor (44 mL). General instructions  Talk with your doctor about ways to manage stress. You can exercise or do deep breathing, meditation, or yoga.  Do not smoke or use products that have nicotine or tobacco. If you need help quitting, ask your doctor.  Keep all follow-up visits as told by your doctor. This is important. Contact a doctor if:  Your symptoms get worse.  Your symptoms go away and then come back. Get help right away if:  You throw up blood or something that looks like coffee grounds.  You have black or dark red poop.  You throw up any time you try to drink fluids.  Your stomach pain gets worse.  You have a fever.  You do not feel better after one week. Summary  Gastritis is swelling (inflammation) of the stomach.  You must get help for this condition. If you do not get help, your stomach can bleed, and you can get sores (ulcers).  This condition is diagnosed with medical history, physical exam, or tests.  You can be treated with medicines for germs or medicines to block too much acid in your stomach. This information is not  intended to replace advice given to you by your health care provider. Make sure you discuss any questions you have with your health care provider. Document Revised: 04/20/2018 Document Reviewed: 04/20/2018 Elsevier Patient Education  2020 [employer].  Hemorrhoids Hemorrhoids are swollen veins that may develop:  In the butt (rectum). These are called internal hemorrhoids.  Around the opening of the butt (anus). These are called external hemorrhoids. Hemorrhoids can cause pain, itching, or bleeding. Most of the time, they do not cause serious problems. They usually get better with diet changes, lifestyle changes, and other home treatments. What are the causes? This condition may be caused by:  Having trouble pooping (constipation).  Pushing hard (straining) to poop.  Watery poop (diarrhea).  Pregnancy.  Being very overweight (obese).  Sitting for long periods of time.  Heavy lifting or other activity that causes you to strain.  Anal sex.  Riding a bike for a long period of time. What are the signs or symptoms? Symptoms of this condition include:  Pain.  Itching or soreness in the butt.  Bleeding from the butt.  Leaking poop.  Swelling in the area.  One or more lumps around the opening of your butt. How is this diagnosed? A doctor can often diagnose this condition by looking at the affected area. The doctor may also:  Do an exam that involves feeling the area with a gloved hand (digital rectal exam).  Examine the area inside your butt using a small tube (anoscope).  Order blood tests. This may be done if you have lost a lot of blood.  Have you get a test that involves looking inside the colon using a flexible tube with a camera on the end (sigmoidoscopy or colonoscopy). How is this treated? This condition can usually be treated at home. Your doctor may tell you to change what you eat, make lifestyle changes, or try home treatments.  If these do not help,  procedures can be done to remove the hemorrhoids or make them smaller. These may involve:  Placing rubber bands at the base of the hemorrhoids to cut off their blood supply.  Injecting medicine into the hemorrhoids to shrink them.  Shining a type of light energy onto the hemorrhoids to cause them to fall off.  Doing surgery to remove the hemorrhoids or cut off their blood supply. Follow these instructions at home: Eating and drinking   Eat foods that have a lot of fiber in them. These include whole grains, beans, nuts, fruits, and vegetables.  Ask your doctor about taking products that have added fiber (fibersupplements).  Reduce the amount of fat in your diet. You can do this by: ? Eating low-fat dairy products. ? Eating less red meat. ? Avoiding processed foods.  Drink enough fluid to keep your pee (urine) pale yellow. Managing pain and swelling   Take a warm-water bath (sitz bath) for 20 minutes to ease pain. Do this 3-4 times a day. You may do this in a bathtub or using a portable sitz bath that fits over the toilet.  If told, put ice on the painful area. It may be helpful to use ice between your warm baths. ? Put ice in a plastic bag. ? Place a towel between your skin and the bag. ? Leave the ice on for 20 minutes, 2-3 times a day. General instructions  Take over-the-counter and prescription medicines only as told by your doctor. ? Medicated creams and medicines may be used as told.  Exercise often. Ask your doctor how much and what kind of exercise is best for you.  Go to the bathroom when you have the urge to poop. Do not wait.  Avoid pushing too hard when you poop.  Keep your butt dry and clean. Use wet toilet paper or moist towelettes after pooping.  Do not sit on the toilet for a long time.  Keep all follow-up visits as told by your doctor. This is important. Contact a doctor if you:  Have pain and swelling that do not get better with treatment or  medicine.  Have trouble pooping.  Cannot poop.  Have pain or swelling outside the area of the hemorrhoids. Get help right away if you have:  Bleeding that will not stop. Summary  Hemorrhoids are swollen veins in the butt or around the opening of the butt.  They can cause pain, itching, or bleeding.  Eat foods that have a lot of fiber in them. These include whole grains, beans, nuts, fruits, and vegetables.  Take a warm-water bath (sitz bath) for 20 minutes to ease pain. Do this 3-4 times a day. This information is not intended to replace advice given to you by your health care provider. Make sure you discuss any questions you have with your health care provider. Document Revised: 12/09/2018 Document Reviewed: 04/22/2018 Elsevier Patient Education  2020 [employer]

## 2020-03-22 NOTE — Op Note (Signed)
[facility] Patient Name: Suzanne Combs Procedure Date: 03/22/2020 8:58 AM MRN: [id number] Date of Birth: 1946-04-13 Attending MD: Lionel December , MD CSN: [id number] Age: 74 Admit Type: Outpatient Procedure:                Colonoscopy Indications:              Chronic diarrhea Providers:                Lionel December, MD, Edrick Kins, RN, Dyann Ruddle Referring MD:             Ignatius Specking, MD Medicines:                Midazolam 1 mg IV Complications:            No immediate complications. Estimated Blood Loss:     Estimated blood loss was minimal. Procedure:                Pre-Anesthesia Assessment:                           - Prior to the procedure, a History and Physical                            was performed, and patient medications and                            allergies were reviewed. The patient's tolerance of                            previous anesthesia was also reviewed. The risks                            and benefits of the procedure and the sedation                            options and risks were discussed with the patient.                            All questions were answered, and informed consent                            was obtained. Prior Anticoagulants: The patient has                            taken no previous anticoagulant or antiplatelet                            agents except for NSAID medication. ASA Grade                            Assessment: II - A patient with mild systemic                            disease. After reviewing the risks and benefits,  the patient was deemed in satisfactory condition to                            undergo the procedure.                           After obtaining informed consent, the colonoscope                            was passed under direct vision. Throughout the                            procedure, the patient's blood pressure, pulse, and                            oxygen  saturations were monitored continuously. The                            [id number]-[id number] ([id number]) scope was introduced through                            the anus and advanced to the the cecum, identified                            by appendiceal orifice and ileocecal valve. The                            colonoscopy was performed without difficulty. The                            patient tolerated the procedure well. The quality                            of the bowel preparation was excellent. The                            ileocecal valve, appendiceal orifice, and rectum                            were photographed. Scope In: 9:02:54 AM Scope Out: 9:21:11 AM Scope Withdrawal Time: 0 hours 9 minutes 22 seconds  Total Procedure Duration: 0 hours 18 minutes 17 seconds  Findings:      Skin tags were found on perianal exam.      Scattered diverticula were found in the sigmoid colon.      The exam was otherwise normal throughout the examined colon.      Biopsies for histology were taken with a cold forceps from the ascending       colon and sigmoid colon for evaluation of microscopic colitis. The       pathology specimen was placed into Bottle Number 3.      External hemorrhoids were found during retroflexion. The hemorrhoids       were small. Impression:               - Perianal skin tags found on perianal exam.                           -  Diverticulosis in the sigmoid colon.                           - External hemorrhoids.                           - Biopsies were taken with a cold forceps from the                            ascending colon and sigmoid colon for evaluation of                            microscopic colitis. Moderate Sedation:      Moderate (conscious) sedation was administered by the endoscopy nurse       and supervised by the endoscopist. The following parameters were       monitored: oxygen saturation, heart rate, blood pressure, CO2       capnography and response to  care. Total physician intraservice time was       23 minutes. Recommendation:           - Patient has a contact number available for                            emergencies. The signs and symptoms of potential                            delayed complications were discussed with the                            patient. Return to normal activities tomorrow.                            Written discharge instructions were provided to the                            patient.                           - Resume previous diet today.                           - Continue present medications.                           - No aspirin, ibuprofen, naproxen, or other                            non-steroidal anti-inflammatory drugs for 1 day.                           - Await pathology results.                           - No recommendation at this time regarding repeat  colonoscopy. Procedure Code(s):        --- Professional ---                           [id number], Colonoscopy, flexible; with biopsy, single                            or multiple                           99153, Moderate sedation; each additional 15                            minutes intraservice time                           G0500, Moderate sedation services provided by the                            same physician or other qualified health care                            professional performing a gastrointestinal                            endoscopic service that sedation supports,                            requiring the presence of an independent trained                            observer to assist in the monitoring of the                            patient's level of consciousness and physiological                            status; initial 15 minutes of intra-service time;                            patient age 28 years or older (additional time may                            be reported with [id number], as  appropriate) Diagnosis Code(s):        --- Professional ---                           K64.4, Residual hemorrhoidal skin tags                           K52.9, Noninfective gastroenteritis and colitis,                            unspecified  K57.30, Diverticulosis of large intestine without                            perforation or abscess without bleeding CPT copyright 2019 American Medical Association. All rights reserved. The codes documented in this report are preliminary and upon coder review may  be revised to meet current compliance requirements. Hildred Laser, MD Hildred Laser, MD 03/22/2020 9:38:29 AM This report has been signed electronically. Number of Addenda: 0

## 2020-03-23 ENCOUNTER — Other Ambulatory Visit: Payer: Self-pay

## 2020-03-26 ENCOUNTER — Other Ambulatory Visit: Payer: Self-pay

## 2020-03-27 ENCOUNTER — Other Ambulatory Visit (INDEPENDENT_AMBULATORY_CARE_PROVIDER_SITE_OTHER): Payer: Self-pay | Admitting: Internal Medicine

## 2020-03-27 ENCOUNTER — Telehealth (INDEPENDENT_AMBULATORY_CARE_PROVIDER_SITE_OTHER): Payer: Self-pay | Admitting: Internal Medicine

## 2020-03-27 DIAGNOSIS — K2960 Other gastritis without bleeding: Secondary | ICD-10-CM

## 2020-03-27 LAB — SURGICAL PATHOLOGY

## 2020-03-27 MED ORDER — RIFAXIMIN 550 MG PO TABS: 550.0000 mg | ORAL_TABLET | Freq: Three times a day (TID) | ORAL | 0 refills | Status: DC

## 2020-03-27 NOTE — Telephone Encounter (Signed)
Patient called stated she had a procedure done on 4/8 -  was told that Dr Karilyn Cota would be giving her a call over the weekend - states she hasn't heard anything - patient was told it usually takes 7 to 10 business days for test results - please advise - [phone]

## 2020-03-27 NOTE — Telephone Encounter (Signed)
Dr.Rehman will be made aware. 

## 2020-03-27 NOTE — Progress Notes (Unsigned)
ifa

## 2020-03-28 NOTE — Telephone Encounter (Signed)
Patient called stated she spoke with Dr Karilyn Cota yesterday and he was to send a prescription into her pharmacy - states the pharmacy needs Dr Karilyn Cota to call them

## 2020-03-28 NOTE — Telephone Encounter (Signed)
Patient called and samples to be given.

## 2020-03-29 DIAGNOSIS — K2960 Other gastritis without bleeding: Secondary | ICD-10-CM | POA: Diagnosis not present

## 2020-04-01 LAB — ANGIOTENSIN CONVERTING ENZYME: Angiotensin-Converting Enzyme: 21 U/L (ref 9–67)

## 2020-04-11 ENCOUNTER — Telehealth (INDEPENDENT_AMBULATORY_CARE_PROVIDER_SITE_OTHER): Payer: Self-pay | Admitting: *Deleted

## 2020-04-11 NOTE — Telephone Encounter (Signed)
Patient presented to the office today . It was the 13 day after her being on the Xifaxan.  She shares that she does not feel that the Xifaxan has helped she is still having the diarrhea. After talking with Dr.Rehman she started taking the Imodium, she says that she is taking 1/2 tablet in the morning and will take a second 1/2 as needed for diarrhea. On a daily basis she takes 1/2 up to 2 additional 1/2 tablets. She is concerned to take the whole tablet as it may cause her to become constipated. Taking the Imodium she is currently going to the bathroom less, today she had already gone 3 times and states that it is diarrhea.  She is aware that Dr.Rehman is not available until next week and this will be reviewed with him.

## 2020-05-23 DIAGNOSIS — Z1331 Encounter for screening for depression: Secondary | ICD-10-CM | POA: Diagnosis not present

## 2020-05-23 DIAGNOSIS — E7800 Pure hypercholesterolemia, unspecified: Secondary | ICD-10-CM | POA: Diagnosis not present

## 2020-05-23 DIAGNOSIS — Z79899 Other long term (current) drug therapy: Secondary | ICD-10-CM | POA: Diagnosis not present

## 2020-05-23 DIAGNOSIS — R5383 Other fatigue: Secondary | ICD-10-CM | POA: Diagnosis not present

## 2020-05-23 DIAGNOSIS — Z0000 Encounter for general adult medical examination without abnormal findings: Secondary | ICD-10-CM | POA: Diagnosis not present

## 2020-05-23 DIAGNOSIS — Z7189 Other specified counseling: Secondary | ICD-10-CM | POA: Diagnosis not present

## 2020-05-23 DIAGNOSIS — Z1339 Encounter for screening examination for other mental health and behavioral disorders: Secondary | ICD-10-CM | POA: Diagnosis not present

## 2020-05-23 DIAGNOSIS — Z6826 Body mass index (BMI) 26.0-26.9, adult: Secondary | ICD-10-CM | POA: Diagnosis not present

## 2020-05-23 DIAGNOSIS — Z299 Encounter for prophylactic measures, unspecified: Secondary | ICD-10-CM | POA: Diagnosis not present

## 2020-05-23 DIAGNOSIS — F17210 Nicotine dependence, cigarettes, uncomplicated: Secondary | ICD-10-CM | POA: Diagnosis not present

## 2020-05-23 DIAGNOSIS — E559 Vitamin D deficiency, unspecified: Secondary | ICD-10-CM | POA: Diagnosis not present

## 2020-05-23 DIAGNOSIS — J449 Chronic obstructive pulmonary disease, unspecified: Secondary | ICD-10-CM | POA: Diagnosis not present

## 2020-07-31 ENCOUNTER — Telehealth (INDEPENDENT_AMBULATORY_CARE_PROVIDER_SITE_OTHER): Payer: Self-pay | Admitting: Internal Medicine

## 2020-08-01 ENCOUNTER — Telehealth (INDEPENDENT_AMBULATORY_CARE_PROVIDER_SITE_OTHER): Payer: Self-pay | Admitting: *Deleted

## 2020-08-01 NOTE — Telephone Encounter (Signed)
Dr.Rehman the patient called the office and stated that the name of the antibiotic was Amoxicillin.  Her PCP gave it to her on 05/23/2020 #30 and it was for 10 days. Her denetist gave it to her on 06/26/2020 #28 and this was for 7 days.

## 2020-08-02 ENCOUNTER — Other Ambulatory Visit (INDEPENDENT_AMBULATORY_CARE_PROVIDER_SITE_OTHER): Payer: Self-pay | Admitting: Internal Medicine

## 2020-08-02 MED ORDER — METRONIDAZOLE 250 MG PO TABS: 250.0000 mg | ORAL_TABLET | Freq: Three times a day (TID) | ORAL | 0 refills | Status: DC

## 2020-08-02 NOTE — Telephone Encounter (Signed)
Patient was seen for 4 months ago.  She was given prescription for Xifaxan but insurance denied it. Meantime she took amoxicillin given to her by her dentist and she says her stools consistency improved to normal.  This.  Last another few weeks.  Now she is back to having diarrhea.  Fact that she responded to amoxicillin suggested she also has small intestinal bacterial overgrowth in addition to IBS.  However repeated exposure to amoxicillin puts her at risk for C. difficile colitis. Will treat her with metronidazole 250 mg after each meal for total of 4 weeks. Advised to keep daily symptom diary while on antibiotic and 2 weeks thereafter.  She will keep a record of stool frequency and consistency and call office with progress report.

## 2020-08-10 DIAGNOSIS — H0288A Meibomian gland dysfunction right eye, upper and lower eyelids: Secondary | ICD-10-CM | POA: Diagnosis not present

## 2020-08-22 DIAGNOSIS — Z961 Presence of intraocular lens: Secondary | ICD-10-CM | POA: Diagnosis not present

## 2020-08-22 DIAGNOSIS — H26493 Other secondary cataract, bilateral: Secondary | ICD-10-CM | POA: Diagnosis not present

## 2020-08-22 DIAGNOSIS — H04123 Dry eye syndrome of bilateral lacrimal glands: Secondary | ICD-10-CM | POA: Diagnosis not present

## 2020-08-22 DIAGNOSIS — H16223 Keratoconjunctivitis sicca, not specified as Sjogren's, bilateral: Secondary | ICD-10-CM | POA: Diagnosis not present

## 2020-08-28 DIAGNOSIS — H40053 Ocular hypertension, bilateral: Secondary | ICD-10-CM | POA: Diagnosis not present

## 2020-08-28 DIAGNOSIS — H524 Presbyopia: Secondary | ICD-10-CM | POA: Diagnosis not present

## 2020-10-17 DIAGNOSIS — Z23 Encounter for immunization: Secondary | ICD-10-CM | POA: Diagnosis not present

## 2020-12-10 DIAGNOSIS — R059 Cough, unspecified: Secondary | ICD-10-CM | POA: Diagnosis not present

## 2020-12-10 DIAGNOSIS — Z299 Encounter for prophylactic measures, unspecified: Secondary | ICD-10-CM | POA: Diagnosis not present

## 2020-12-10 DIAGNOSIS — F17210 Nicotine dependence, cigarettes, uncomplicated: Secondary | ICD-10-CM | POA: Diagnosis not present

## 2020-12-10 DIAGNOSIS — J441 Chronic obstructive pulmonary disease with (acute) exacerbation: Secondary | ICD-10-CM | POA: Diagnosis not present

## 2020-12-10 DIAGNOSIS — J329 Chronic sinusitis, unspecified: Secondary | ICD-10-CM | POA: Diagnosis not present

## 2020-12-28 DIAGNOSIS — J441 Chronic obstructive pulmonary disease with (acute) exacerbation: Secondary | ICD-10-CM | POA: Diagnosis not present

## 2020-12-28 DIAGNOSIS — J329 Chronic sinusitis, unspecified: Secondary | ICD-10-CM | POA: Diagnosis not present

## 2020-12-28 DIAGNOSIS — R059 Cough, unspecified: Secondary | ICD-10-CM | POA: Diagnosis not present

## 2020-12-28 DIAGNOSIS — J449 Chronic obstructive pulmonary disease, unspecified: Secondary | ICD-10-CM | POA: Diagnosis not present

## 2020-12-28 DIAGNOSIS — Z299 Encounter for prophylactic measures, unspecified: Secondary | ICD-10-CM | POA: Diagnosis not present

## 2020-12-28 DIAGNOSIS — F17210 Nicotine dependence, cigarettes, uncomplicated: Secondary | ICD-10-CM | POA: Diagnosis not present

## 2021-01-03 DIAGNOSIS — Z1231 Encounter for screening mammogram for malignant neoplasm of breast: Secondary | ICD-10-CM | POA: Diagnosis not present

## 2021-02-21 NOTE — Telephone Encounter (Signed)
Error

## 2021-05-28 DIAGNOSIS — Z7189 Other specified counseling: Secondary | ICD-10-CM | POA: Diagnosis not present

## 2021-05-28 DIAGNOSIS — Z0000 Encounter for general adult medical examination without abnormal findings: Secondary | ICD-10-CM | POA: Diagnosis not present

## 2021-05-28 DIAGNOSIS — E7800 Pure hypercholesterolemia, unspecified: Secondary | ICD-10-CM | POA: Diagnosis not present

## 2021-05-28 DIAGNOSIS — Z6826 Body mass index (BMI) 26.0-26.9, adult: Secondary | ICD-10-CM | POA: Diagnosis not present

## 2021-05-28 DIAGNOSIS — Z1339 Encounter for screening examination for other mental health and behavioral disorders: Secondary | ICD-10-CM | POA: Diagnosis not present

## 2021-05-28 DIAGNOSIS — J449 Chronic obstructive pulmonary disease, unspecified: Secondary | ICD-10-CM | POA: Diagnosis not present

## 2021-05-28 DIAGNOSIS — R5383 Other fatigue: Secondary | ICD-10-CM | POA: Diagnosis not present

## 2021-05-28 DIAGNOSIS — F17210 Nicotine dependence, cigarettes, uncomplicated: Secondary | ICD-10-CM | POA: Diagnosis not present

## 2021-05-28 DIAGNOSIS — Z1331 Encounter for screening for depression: Secondary | ICD-10-CM | POA: Diagnosis not present

## 2021-05-28 DIAGNOSIS — Z299 Encounter for prophylactic measures, unspecified: Secondary | ICD-10-CM | POA: Diagnosis not present

## 2021-05-28 DIAGNOSIS — E559 Vitamin D deficiency, unspecified: Secondary | ICD-10-CM | POA: Diagnosis not present

## 2021-06-03 DIAGNOSIS — M5441 Lumbago with sciatica, right side: Secondary | ICD-10-CM | POA: Diagnosis not present

## 2021-06-03 DIAGNOSIS — M9903 Segmental and somatic dysfunction of lumbar region: Secondary | ICD-10-CM | POA: Diagnosis not present

## 2021-06-03 DIAGNOSIS — M47816 Spondylosis without myelopathy or radiculopathy, lumbar region: Secondary | ICD-10-CM | POA: Diagnosis not present

## 2021-06-05 DIAGNOSIS — M47816 Spondylosis without myelopathy or radiculopathy, lumbar region: Secondary | ICD-10-CM | POA: Diagnosis not present

## 2021-06-05 DIAGNOSIS — M9903 Segmental and somatic dysfunction of lumbar region: Secondary | ICD-10-CM | POA: Diagnosis not present

## 2021-06-05 DIAGNOSIS — M5441 Lumbago with sciatica, right side: Secondary | ICD-10-CM | POA: Diagnosis not present

## 2021-06-07 DIAGNOSIS — M5441 Lumbago with sciatica, right side: Secondary | ICD-10-CM | POA: Diagnosis not present

## 2021-06-07 DIAGNOSIS — M9903 Segmental and somatic dysfunction of lumbar region: Secondary | ICD-10-CM | POA: Diagnosis not present

## 2021-06-07 DIAGNOSIS — M47816 Spondylosis without myelopathy or radiculopathy, lumbar region: Secondary | ICD-10-CM | POA: Diagnosis not present

## 2021-06-11 DIAGNOSIS — M5441 Lumbago with sciatica, right side: Secondary | ICD-10-CM | POA: Diagnosis not present

## 2021-06-11 DIAGNOSIS — M47816 Spondylosis without myelopathy or radiculopathy, lumbar region: Secondary | ICD-10-CM | POA: Diagnosis not present

## 2021-06-11 DIAGNOSIS — M9903 Segmental and somatic dysfunction of lumbar region: Secondary | ICD-10-CM | POA: Diagnosis not present

## 2021-06-14 DIAGNOSIS — M9903 Segmental and somatic dysfunction of lumbar region: Secondary | ICD-10-CM | POA: Diagnosis not present

## 2021-06-14 DIAGNOSIS — M47816 Spondylosis without myelopathy or radiculopathy, lumbar region: Secondary | ICD-10-CM | POA: Diagnosis not present

## 2021-06-14 DIAGNOSIS — M5441 Lumbago with sciatica, right side: Secondary | ICD-10-CM | POA: Diagnosis not present

## 2021-06-20 DIAGNOSIS — M5441 Lumbago with sciatica, right side: Secondary | ICD-10-CM | POA: Diagnosis not present

## 2021-06-20 DIAGNOSIS — M9903 Segmental and somatic dysfunction of lumbar region: Secondary | ICD-10-CM | POA: Diagnosis not present

## 2021-06-20 DIAGNOSIS — M47816 Spondylosis without myelopathy or radiculopathy, lumbar region: Secondary | ICD-10-CM | POA: Diagnosis not present

## 2021-06-26 DIAGNOSIS — M5441 Lumbago with sciatica, right side: Secondary | ICD-10-CM | POA: Diagnosis not present

## 2021-06-26 DIAGNOSIS — M47816 Spondylosis without myelopathy or radiculopathy, lumbar region: Secondary | ICD-10-CM | POA: Diagnosis not present

## 2021-06-26 DIAGNOSIS — M9903 Segmental and somatic dysfunction of lumbar region: Secondary | ICD-10-CM | POA: Diagnosis not present

## 2021-06-28 DIAGNOSIS — M9903 Segmental and somatic dysfunction of lumbar region: Secondary | ICD-10-CM | POA: Diagnosis not present

## 2021-06-28 DIAGNOSIS — M47816 Spondylosis without myelopathy or radiculopathy, lumbar region: Secondary | ICD-10-CM | POA: Diagnosis not present

## 2021-06-28 DIAGNOSIS — M5441 Lumbago with sciatica, right side: Secondary | ICD-10-CM | POA: Diagnosis not present

## 2021-07-02 DIAGNOSIS — M9903 Segmental and somatic dysfunction of lumbar region: Secondary | ICD-10-CM | POA: Diagnosis not present

## 2021-07-02 DIAGNOSIS — M47816 Spondylosis without myelopathy or radiculopathy, lumbar region: Secondary | ICD-10-CM | POA: Diagnosis not present

## 2021-07-02 DIAGNOSIS — M5441 Lumbago with sciatica, right side: Secondary | ICD-10-CM | POA: Diagnosis not present

## 2021-07-05 DIAGNOSIS — M9903 Segmental and somatic dysfunction of lumbar region: Secondary | ICD-10-CM | POA: Diagnosis not present

## 2021-07-05 DIAGNOSIS — M5441 Lumbago with sciatica, right side: Secondary | ICD-10-CM | POA: Diagnosis not present

## 2021-07-05 DIAGNOSIS — M47816 Spondylosis without myelopathy or radiculopathy, lumbar region: Secondary | ICD-10-CM | POA: Diagnosis not present

## 2021-07-09 DIAGNOSIS — M5441 Lumbago with sciatica, right side: Secondary | ICD-10-CM | POA: Diagnosis not present

## 2021-07-09 DIAGNOSIS — M9903 Segmental and somatic dysfunction of lumbar region: Secondary | ICD-10-CM | POA: Diagnosis not present

## 2021-07-09 DIAGNOSIS — M47816 Spondylosis without myelopathy or radiculopathy, lumbar region: Secondary | ICD-10-CM | POA: Diagnosis not present

## 2021-07-12 DIAGNOSIS — M9903 Segmental and somatic dysfunction of lumbar region: Secondary | ICD-10-CM | POA: Diagnosis not present

## 2021-07-12 DIAGNOSIS — M5441 Lumbago with sciatica, right side: Secondary | ICD-10-CM | POA: Diagnosis not present

## 2021-07-12 DIAGNOSIS — M47816 Spondylosis without myelopathy or radiculopathy, lumbar region: Secondary | ICD-10-CM | POA: Diagnosis not present

## 2021-07-16 DIAGNOSIS — M5441 Lumbago with sciatica, right side: Secondary | ICD-10-CM | POA: Diagnosis not present

## 2021-07-16 DIAGNOSIS — M9903 Segmental and somatic dysfunction of lumbar region: Secondary | ICD-10-CM | POA: Diagnosis not present

## 2021-07-16 DIAGNOSIS — M47816 Spondylosis without myelopathy or radiculopathy, lumbar region: Secondary | ICD-10-CM | POA: Diagnosis not present

## 2021-07-25 DIAGNOSIS — M5441 Lumbago with sciatica, right side: Secondary | ICD-10-CM | POA: Diagnosis not present

## 2021-07-25 DIAGNOSIS — M47816 Spondylosis without myelopathy or radiculopathy, lumbar region: Secondary | ICD-10-CM | POA: Diagnosis not present

## 2021-07-25 DIAGNOSIS — M9903 Segmental and somatic dysfunction of lumbar region: Secondary | ICD-10-CM | POA: Diagnosis not present

## 2021-08-01 DIAGNOSIS — M9903 Segmental and somatic dysfunction of lumbar region: Secondary | ICD-10-CM | POA: Diagnosis not present

## 2021-08-01 DIAGNOSIS — M47816 Spondylosis without myelopathy or radiculopathy, lumbar region: Secondary | ICD-10-CM | POA: Diagnosis not present

## 2021-08-01 DIAGNOSIS — M5441 Lumbago with sciatica, right side: Secondary | ICD-10-CM | POA: Diagnosis not present

## 2021-08-08 DIAGNOSIS — M5441 Lumbago with sciatica, right side: Secondary | ICD-10-CM | POA: Diagnosis not present

## 2021-08-08 DIAGNOSIS — M47816 Spondylosis without myelopathy or radiculopathy, lumbar region: Secondary | ICD-10-CM | POA: Diagnosis not present

## 2021-08-08 DIAGNOSIS — M9903 Segmental and somatic dysfunction of lumbar region: Secondary | ICD-10-CM | POA: Diagnosis not present

## 2021-08-14 DIAGNOSIS — M47816 Spondylosis without myelopathy or radiculopathy, lumbar region: Secondary | ICD-10-CM | POA: Diagnosis not present

## 2021-08-14 DIAGNOSIS — M5441 Lumbago with sciatica, right side: Secondary | ICD-10-CM | POA: Diagnosis not present

## 2021-08-14 DIAGNOSIS — M9903 Segmental and somatic dysfunction of lumbar region: Secondary | ICD-10-CM | POA: Diagnosis not present

## 2021-08-15 DIAGNOSIS — M25561 Pain in right knee: Secondary | ICD-10-CM | POA: Diagnosis not present

## 2021-08-22 DIAGNOSIS — M5441 Lumbago with sciatica, right side: Secondary | ICD-10-CM | POA: Diagnosis not present

## 2021-08-22 DIAGNOSIS — M9903 Segmental and somatic dysfunction of lumbar region: Secondary | ICD-10-CM | POA: Diagnosis not present

## 2021-08-22 DIAGNOSIS — M47816 Spondylosis without myelopathy or radiculopathy, lumbar region: Secondary | ICD-10-CM | POA: Diagnosis not present

## 2021-08-29 DIAGNOSIS — M9903 Segmental and somatic dysfunction of lumbar region: Secondary | ICD-10-CM | POA: Diagnosis not present

## 2021-08-29 DIAGNOSIS — M47816 Spondylosis without myelopathy or radiculopathy, lumbar region: Secondary | ICD-10-CM | POA: Diagnosis not present

## 2021-08-29 DIAGNOSIS — M5441 Lumbago with sciatica, right side: Secondary | ICD-10-CM | POA: Diagnosis not present

## 2021-09-05 DIAGNOSIS — M47816 Spondylosis without myelopathy or radiculopathy, lumbar region: Secondary | ICD-10-CM | POA: Diagnosis not present

## 2021-09-05 DIAGNOSIS — M5441 Lumbago with sciatica, right side: Secondary | ICD-10-CM | POA: Diagnosis not present

## 2021-09-05 DIAGNOSIS — M9903 Segmental and somatic dysfunction of lumbar region: Secondary | ICD-10-CM | POA: Diagnosis not present

## 2021-09-12 DIAGNOSIS — M9903 Segmental and somatic dysfunction of lumbar region: Secondary | ICD-10-CM | POA: Diagnosis not present

## 2021-09-12 DIAGNOSIS — M47816 Spondylosis without myelopathy or radiculopathy, lumbar region: Secondary | ICD-10-CM | POA: Diagnosis not present

## 2021-09-12 DIAGNOSIS — M5441 Lumbago with sciatica, right side: Secondary | ICD-10-CM | POA: Diagnosis not present

## 2021-09-19 DIAGNOSIS — M5441 Lumbago with sciatica, right side: Secondary | ICD-10-CM | POA: Diagnosis not present

## 2021-09-19 DIAGNOSIS — M9903 Segmental and somatic dysfunction of lumbar region: Secondary | ICD-10-CM | POA: Diagnosis not present

## 2021-09-19 DIAGNOSIS — M47816 Spondylosis without myelopathy or radiculopathy, lumbar region: Secondary | ICD-10-CM | POA: Diagnosis not present

## 2021-09-20 DIAGNOSIS — M9903 Segmental and somatic dysfunction of lumbar region: Secondary | ICD-10-CM | POA: Diagnosis not present

## 2021-09-20 DIAGNOSIS — M47816 Spondylosis without myelopathy or radiculopathy, lumbar region: Secondary | ICD-10-CM | POA: Diagnosis not present

## 2021-09-20 DIAGNOSIS — M5441 Lumbago with sciatica, right side: Secondary | ICD-10-CM | POA: Diagnosis not present

## 2021-09-23 DIAGNOSIS — M5441 Lumbago with sciatica, right side: Secondary | ICD-10-CM | POA: Diagnosis not present

## 2021-09-23 DIAGNOSIS — M47816 Spondylosis without myelopathy or radiculopathy, lumbar region: Secondary | ICD-10-CM | POA: Diagnosis not present

## 2021-09-23 DIAGNOSIS — M9903 Segmental and somatic dysfunction of lumbar region: Secondary | ICD-10-CM | POA: Diagnosis not present

## 2021-09-26 DIAGNOSIS — M9903 Segmental and somatic dysfunction of lumbar region: Secondary | ICD-10-CM | POA: Diagnosis not present

## 2021-09-26 DIAGNOSIS — M47816 Spondylosis without myelopathy or radiculopathy, lumbar region: Secondary | ICD-10-CM | POA: Diagnosis not present

## 2021-09-26 DIAGNOSIS — H40053 Ocular hypertension, bilateral: Secondary | ICD-10-CM | POA: Diagnosis not present

## 2021-09-26 DIAGNOSIS — M5441 Lumbago with sciatica, right side: Secondary | ICD-10-CM | POA: Diagnosis not present

## 2021-09-26 DIAGNOSIS — H524 Presbyopia: Secondary | ICD-10-CM | POA: Diagnosis not present

## 2021-10-02 DIAGNOSIS — M5441 Lumbago with sciatica, right side: Secondary | ICD-10-CM | POA: Diagnosis not present

## 2021-10-02 DIAGNOSIS — M9903 Segmental and somatic dysfunction of lumbar region: Secondary | ICD-10-CM | POA: Diagnosis not present

## 2021-10-02 DIAGNOSIS — M47816 Spondylosis without myelopathy or radiculopathy, lumbar region: Secondary | ICD-10-CM | POA: Diagnosis not present

## 2021-10-08 DIAGNOSIS — M5441 Lumbago with sciatica, right side: Secondary | ICD-10-CM | POA: Diagnosis not present

## 2021-10-08 DIAGNOSIS — M47816 Spondylosis without myelopathy or radiculopathy, lumbar region: Secondary | ICD-10-CM | POA: Diagnosis not present

## 2021-10-08 DIAGNOSIS — M9903 Segmental and somatic dysfunction of lumbar region: Secondary | ICD-10-CM | POA: Diagnosis not present

## 2021-10-15 DIAGNOSIS — M9902 Segmental and somatic dysfunction of thoracic region: Secondary | ICD-10-CM | POA: Diagnosis not present

## 2021-10-15 DIAGNOSIS — M9903 Segmental and somatic dysfunction of lumbar region: Secondary | ICD-10-CM | POA: Diagnosis not present

## 2021-10-15 DIAGNOSIS — M9901 Segmental and somatic dysfunction of cervical region: Secondary | ICD-10-CM | POA: Diagnosis not present

## 2021-10-15 DIAGNOSIS — M5441 Lumbago with sciatica, right side: Secondary | ICD-10-CM | POA: Diagnosis not present

## 2021-10-15 DIAGNOSIS — M47812 Spondylosis without myelopathy or radiculopathy, cervical region: Secondary | ICD-10-CM | POA: Diagnosis not present

## 2021-10-15 DIAGNOSIS — M47816 Spondylosis without myelopathy or radiculopathy, lumbar region: Secondary | ICD-10-CM | POA: Diagnosis not present

## 2021-10-17 DIAGNOSIS — H26491 Other secondary cataract, right eye: Secondary | ICD-10-CM | POA: Diagnosis not present

## 2021-10-22 DIAGNOSIS — M9902 Segmental and somatic dysfunction of thoracic region: Secondary | ICD-10-CM | POA: Diagnosis not present

## 2021-10-22 DIAGNOSIS — M47812 Spondylosis without myelopathy or radiculopathy, cervical region: Secondary | ICD-10-CM | POA: Diagnosis not present

## 2021-10-22 DIAGNOSIS — M47816 Spondylosis without myelopathy or radiculopathy, lumbar region: Secondary | ICD-10-CM | POA: Diagnosis not present

## 2021-10-22 DIAGNOSIS — M5441 Lumbago with sciatica, right side: Secondary | ICD-10-CM | POA: Diagnosis not present

## 2021-10-22 DIAGNOSIS — M9903 Segmental and somatic dysfunction of lumbar region: Secondary | ICD-10-CM | POA: Diagnosis not present

## 2021-10-22 DIAGNOSIS — M9901 Segmental and somatic dysfunction of cervical region: Secondary | ICD-10-CM | POA: Diagnosis not present

## 2021-10-29 DIAGNOSIS — M9902 Segmental and somatic dysfunction of thoracic region: Secondary | ICD-10-CM | POA: Diagnosis not present

## 2021-10-29 DIAGNOSIS — M9901 Segmental and somatic dysfunction of cervical region: Secondary | ICD-10-CM | POA: Diagnosis not present

## 2021-10-29 DIAGNOSIS — M5441 Lumbago with sciatica, right side: Secondary | ICD-10-CM | POA: Diagnosis not present

## 2021-10-29 DIAGNOSIS — M9903 Segmental and somatic dysfunction of lumbar region: Secondary | ICD-10-CM | POA: Diagnosis not present

## 2021-10-29 DIAGNOSIS — M47812 Spondylosis without myelopathy or radiculopathy, cervical region: Secondary | ICD-10-CM | POA: Diagnosis not present

## 2021-10-29 DIAGNOSIS — M47816 Spondylosis without myelopathy or radiculopathy, lumbar region: Secondary | ICD-10-CM | POA: Diagnosis not present

## 2021-11-12 DIAGNOSIS — M47816 Spondylosis without myelopathy or radiculopathy, lumbar region: Secondary | ICD-10-CM | POA: Diagnosis not present

## 2021-11-12 DIAGNOSIS — M9901 Segmental and somatic dysfunction of cervical region: Secondary | ICD-10-CM | POA: Diagnosis not present

## 2021-11-12 DIAGNOSIS — M9903 Segmental and somatic dysfunction of lumbar region: Secondary | ICD-10-CM | POA: Diagnosis not present

## 2021-11-12 DIAGNOSIS — M9902 Segmental and somatic dysfunction of thoracic region: Secondary | ICD-10-CM | POA: Diagnosis not present

## 2021-11-12 DIAGNOSIS — M5441 Lumbago with sciatica, right side: Secondary | ICD-10-CM | POA: Diagnosis not present

## 2021-11-12 DIAGNOSIS — M47812 Spondylosis without myelopathy or radiculopathy, cervical region: Secondary | ICD-10-CM | POA: Diagnosis not present

## 2021-11-21 DIAGNOSIS — F17210 Nicotine dependence, cigarettes, uncomplicated: Secondary | ICD-10-CM | POA: Diagnosis not present

## 2021-11-21 DIAGNOSIS — Z299 Encounter for prophylactic measures, unspecified: Secondary | ICD-10-CM | POA: Diagnosis not present

## 2021-11-21 DIAGNOSIS — U071 COVID-19: Secondary | ICD-10-CM | POA: Diagnosis not present

## 2021-11-21 DIAGNOSIS — J449 Chronic obstructive pulmonary disease, unspecified: Secondary | ICD-10-CM | POA: Diagnosis not present

## 2021-11-29 DIAGNOSIS — J40 Bronchitis, not specified as acute or chronic: Secondary | ICD-10-CM | POA: Diagnosis not present

## 2021-11-29 DIAGNOSIS — Z299 Encounter for prophylactic measures, unspecified: Secondary | ICD-10-CM | POA: Diagnosis not present

## 2021-11-29 DIAGNOSIS — J209 Acute bronchitis, unspecified: Secondary | ICD-10-CM | POA: Diagnosis not present

## 2021-11-29 DIAGNOSIS — Z20822 Contact with and (suspected) exposure to covid-19: Secondary | ICD-10-CM | POA: Diagnosis not present

## 2021-11-29 DIAGNOSIS — Z6826 Body mass index (BMI) 26.0-26.9, adult: Secondary | ICD-10-CM | POA: Diagnosis not present

## 2021-12-04 DIAGNOSIS — Z87891 Personal history of nicotine dependence: Secondary | ICD-10-CM | POA: Diagnosis not present

## 2021-12-04 DIAGNOSIS — J209 Acute bronchitis, unspecified: Secondary | ICD-10-CM | POA: Diagnosis not present

## 2021-12-04 DIAGNOSIS — Z299 Encounter for prophylactic measures, unspecified: Secondary | ICD-10-CM | POA: Diagnosis not present

## 2021-12-04 DIAGNOSIS — Z6826 Body mass index (BMI) 26.0-26.9, adult: Secondary | ICD-10-CM | POA: Diagnosis not present

## 2021-12-04 DIAGNOSIS — R079 Chest pain, unspecified: Secondary | ICD-10-CM | POA: Diagnosis not present

## 2021-12-12 DIAGNOSIS — M9903 Segmental and somatic dysfunction of lumbar region: Secondary | ICD-10-CM | POA: Diagnosis not present

## 2021-12-12 DIAGNOSIS — M47816 Spondylosis without myelopathy or radiculopathy, lumbar region: Secondary | ICD-10-CM | POA: Diagnosis not present

## 2021-12-12 DIAGNOSIS — M9901 Segmental and somatic dysfunction of cervical region: Secondary | ICD-10-CM | POA: Diagnosis not present

## 2021-12-12 DIAGNOSIS — M47812 Spondylosis without myelopathy or radiculopathy, cervical region: Secondary | ICD-10-CM | POA: Diagnosis not present

## 2021-12-12 DIAGNOSIS — M9902 Segmental and somatic dysfunction of thoracic region: Secondary | ICD-10-CM | POA: Diagnosis not present

## 2021-12-12 DIAGNOSIS — M5441 Lumbago with sciatica, right side: Secondary | ICD-10-CM | POA: Diagnosis not present

## 2021-12-19 DIAGNOSIS — H0520 Unspecified exophthalmos: Secondary | ICD-10-CM | POA: Diagnosis not present

## 2021-12-19 DIAGNOSIS — J329 Chronic sinusitis, unspecified: Secondary | ICD-10-CM | POA: Diagnosis not present

## 2021-12-19 DIAGNOSIS — Z6826 Body mass index (BMI) 26.0-26.9, adult: Secondary | ICD-10-CM | POA: Diagnosis not present

## 2021-12-19 DIAGNOSIS — Z299 Encounter for prophylactic measures, unspecified: Secondary | ICD-10-CM | POA: Diagnosis not present

## 2021-12-19 DIAGNOSIS — J449 Chronic obstructive pulmonary disease, unspecified: Secondary | ICD-10-CM | POA: Diagnosis not present

## 2021-12-19 DIAGNOSIS — Z6827 Body mass index (BMI) 27.0-27.9, adult: Secondary | ICD-10-CM | POA: Diagnosis not present

## 2022-01-02 DIAGNOSIS — M47816 Spondylosis without myelopathy or radiculopathy, lumbar region: Secondary | ICD-10-CM | POA: Diagnosis not present

## 2022-01-02 DIAGNOSIS — M9903 Segmental and somatic dysfunction of lumbar region: Secondary | ICD-10-CM | POA: Diagnosis not present

## 2022-01-02 DIAGNOSIS — M9902 Segmental and somatic dysfunction of thoracic region: Secondary | ICD-10-CM | POA: Diagnosis not present

## 2022-01-02 DIAGNOSIS — M5441 Lumbago with sciatica, right side: Secondary | ICD-10-CM | POA: Diagnosis not present

## 2022-01-02 DIAGNOSIS — M47812 Spondylosis without myelopathy or radiculopathy, cervical region: Secondary | ICD-10-CM | POA: Diagnosis not present

## 2022-01-02 DIAGNOSIS — M9901 Segmental and somatic dysfunction of cervical region: Secondary | ICD-10-CM | POA: Diagnosis not present

## 2022-01-23 DIAGNOSIS — M47816 Spondylosis without myelopathy or radiculopathy, lumbar region: Secondary | ICD-10-CM | POA: Diagnosis not present

## 2022-01-23 DIAGNOSIS — M5441 Lumbago with sciatica, right side: Secondary | ICD-10-CM | POA: Diagnosis not present

## 2022-01-23 DIAGNOSIS — M9903 Segmental and somatic dysfunction of lumbar region: Secondary | ICD-10-CM | POA: Diagnosis not present

## 2022-01-23 DIAGNOSIS — M9901 Segmental and somatic dysfunction of cervical region: Secondary | ICD-10-CM | POA: Diagnosis not present

## 2022-01-23 DIAGNOSIS — M9902 Segmental and somatic dysfunction of thoracic region: Secondary | ICD-10-CM | POA: Diagnosis not present

## 2022-01-23 DIAGNOSIS — M47812 Spondylosis without myelopathy or radiculopathy, cervical region: Secondary | ICD-10-CM | POA: Diagnosis not present

## 2022-02-03 DIAGNOSIS — R7989 Other specified abnormal findings of blood chemistry: Secondary | ICD-10-CM | POA: Diagnosis not present

## 2022-02-13 DIAGNOSIS — M47816 Spondylosis without myelopathy or radiculopathy, lumbar region: Secondary | ICD-10-CM | POA: Diagnosis not present

## 2022-02-13 DIAGNOSIS — M47812 Spondylosis without myelopathy or radiculopathy, cervical region: Secondary | ICD-10-CM | POA: Diagnosis not present

## 2022-02-13 DIAGNOSIS — M5441 Lumbago with sciatica, right side: Secondary | ICD-10-CM | POA: Diagnosis not present

## 2022-02-13 DIAGNOSIS — M9901 Segmental and somatic dysfunction of cervical region: Secondary | ICD-10-CM | POA: Diagnosis not present

## 2022-02-13 DIAGNOSIS — M9902 Segmental and somatic dysfunction of thoracic region: Secondary | ICD-10-CM | POA: Diagnosis not present

## 2022-02-13 DIAGNOSIS — M9903 Segmental and somatic dysfunction of lumbar region: Secondary | ICD-10-CM | POA: Diagnosis not present

## 2022-02-18 DIAGNOSIS — M9903 Segmental and somatic dysfunction of lumbar region: Secondary | ICD-10-CM | POA: Diagnosis not present

## 2022-02-18 DIAGNOSIS — Z1231 Encounter for screening mammogram for malignant neoplasm of breast: Secondary | ICD-10-CM | POA: Diagnosis not present

## 2022-02-18 DIAGNOSIS — M47816 Spondylosis without myelopathy or radiculopathy, lumbar region: Secondary | ICD-10-CM | POA: Diagnosis not present

## 2022-02-18 DIAGNOSIS — M9902 Segmental and somatic dysfunction of thoracic region: Secondary | ICD-10-CM | POA: Diagnosis not present

## 2022-02-18 DIAGNOSIS — M5441 Lumbago with sciatica, right side: Secondary | ICD-10-CM | POA: Diagnosis not present

## 2022-02-18 DIAGNOSIS — M9901 Segmental and somatic dysfunction of cervical region: Secondary | ICD-10-CM | POA: Diagnosis not present

## 2022-02-18 DIAGNOSIS — M47812 Spondylosis without myelopathy or radiculopathy, cervical region: Secondary | ICD-10-CM | POA: Diagnosis not present

## 2022-02-20 DIAGNOSIS — H02831 Dermatochalasis of right upper eyelid: Secondary | ICD-10-CM | POA: Diagnosis not present

## 2022-02-20 DIAGNOSIS — H02834 Dermatochalasis of left upper eyelid: Secondary | ICD-10-CM | POA: Diagnosis not present

## 2022-02-20 DIAGNOSIS — H019 Unspecified inflammation of eyelid: Secondary | ICD-10-CM | POA: Diagnosis not present

## 2022-02-20 DIAGNOSIS — H02413 Mechanical ptosis of bilateral eyelids: Secondary | ICD-10-CM | POA: Diagnosis not present

## 2022-02-20 DIAGNOSIS — H57813 Brow ptosis, bilateral: Secondary | ICD-10-CM | POA: Diagnosis not present

## 2022-02-21 DIAGNOSIS — M47816 Spondylosis without myelopathy or radiculopathy, lumbar region: Secondary | ICD-10-CM | POA: Diagnosis not present

## 2022-02-21 DIAGNOSIS — M9903 Segmental and somatic dysfunction of lumbar region: Secondary | ICD-10-CM | POA: Diagnosis not present

## 2022-02-21 DIAGNOSIS — M9902 Segmental and somatic dysfunction of thoracic region: Secondary | ICD-10-CM | POA: Diagnosis not present

## 2022-02-21 DIAGNOSIS — M5441 Lumbago with sciatica, right side: Secondary | ICD-10-CM | POA: Diagnosis not present

## 2022-02-21 DIAGNOSIS — M9901 Segmental and somatic dysfunction of cervical region: Secondary | ICD-10-CM | POA: Diagnosis not present

## 2022-02-21 DIAGNOSIS — M47812 Spondylosis without myelopathy or radiculopathy, cervical region: Secondary | ICD-10-CM | POA: Diagnosis not present

## 2022-02-28 DIAGNOSIS — M9902 Segmental and somatic dysfunction of thoracic region: Secondary | ICD-10-CM | POA: Diagnosis not present

## 2022-02-28 DIAGNOSIS — M47816 Spondylosis without myelopathy or radiculopathy, lumbar region: Secondary | ICD-10-CM | POA: Diagnosis not present

## 2022-02-28 DIAGNOSIS — M5441 Lumbago with sciatica, right side: Secondary | ICD-10-CM | POA: Diagnosis not present

## 2022-02-28 DIAGNOSIS — M47812 Spondylosis without myelopathy or radiculopathy, cervical region: Secondary | ICD-10-CM | POA: Diagnosis not present

## 2022-02-28 DIAGNOSIS — M9901 Segmental and somatic dysfunction of cervical region: Secondary | ICD-10-CM | POA: Diagnosis not present

## 2022-02-28 DIAGNOSIS — M9903 Segmental and somatic dysfunction of lumbar region: Secondary | ICD-10-CM | POA: Diagnosis not present

## 2022-03-07 DIAGNOSIS — M9903 Segmental and somatic dysfunction of lumbar region: Secondary | ICD-10-CM | POA: Diagnosis not present

## 2022-03-07 DIAGNOSIS — M5441 Lumbago with sciatica, right side: Secondary | ICD-10-CM | POA: Diagnosis not present

## 2022-03-07 DIAGNOSIS — M9901 Segmental and somatic dysfunction of cervical region: Secondary | ICD-10-CM | POA: Diagnosis not present

## 2022-03-07 DIAGNOSIS — M47812 Spondylosis without myelopathy or radiculopathy, cervical region: Secondary | ICD-10-CM | POA: Diagnosis not present

## 2022-03-07 DIAGNOSIS — M9902 Segmental and somatic dysfunction of thoracic region: Secondary | ICD-10-CM | POA: Diagnosis not present

## 2022-03-07 DIAGNOSIS — M47816 Spondylosis without myelopathy or radiculopathy, lumbar region: Secondary | ICD-10-CM | POA: Diagnosis not present

## 2022-03-14 DIAGNOSIS — M9901 Segmental and somatic dysfunction of cervical region: Secondary | ICD-10-CM | POA: Diagnosis not present

## 2022-03-14 DIAGNOSIS — M5441 Lumbago with sciatica, right side: Secondary | ICD-10-CM | POA: Diagnosis not present

## 2022-03-14 DIAGNOSIS — M9903 Segmental and somatic dysfunction of lumbar region: Secondary | ICD-10-CM | POA: Diagnosis not present

## 2022-03-14 DIAGNOSIS — M47816 Spondylosis without myelopathy or radiculopathy, lumbar region: Secondary | ICD-10-CM | POA: Diagnosis not present

## 2022-03-14 DIAGNOSIS — M9902 Segmental and somatic dysfunction of thoracic region: Secondary | ICD-10-CM | POA: Diagnosis not present

## 2022-03-14 DIAGNOSIS — M47812 Spondylosis without myelopathy or radiculopathy, cervical region: Secondary | ICD-10-CM | POA: Diagnosis not present

## 2022-03-25 DIAGNOSIS — M47812 Spondylosis without myelopathy or radiculopathy, cervical region: Secondary | ICD-10-CM | POA: Diagnosis not present

## 2022-03-25 DIAGNOSIS — M5441 Lumbago with sciatica, right side: Secondary | ICD-10-CM | POA: Diagnosis not present

## 2022-03-25 DIAGNOSIS — M9902 Segmental and somatic dysfunction of thoracic region: Secondary | ICD-10-CM | POA: Diagnosis not present

## 2022-03-25 DIAGNOSIS — M9901 Segmental and somatic dysfunction of cervical region: Secondary | ICD-10-CM | POA: Diagnosis not present

## 2022-03-25 DIAGNOSIS — M47816 Spondylosis without myelopathy or radiculopathy, lumbar region: Secondary | ICD-10-CM | POA: Diagnosis not present

## 2022-03-25 DIAGNOSIS — M9903 Segmental and somatic dysfunction of lumbar region: Secondary | ICD-10-CM | POA: Diagnosis not present

## 2022-03-28 DIAGNOSIS — M9903 Segmental and somatic dysfunction of lumbar region: Secondary | ICD-10-CM | POA: Diagnosis not present

## 2022-03-28 DIAGNOSIS — M5441 Lumbago with sciatica, right side: Secondary | ICD-10-CM | POA: Diagnosis not present

## 2022-03-28 DIAGNOSIS — M9901 Segmental and somatic dysfunction of cervical region: Secondary | ICD-10-CM | POA: Diagnosis not present

## 2022-03-28 DIAGNOSIS — M47812 Spondylosis without myelopathy or radiculopathy, cervical region: Secondary | ICD-10-CM | POA: Diagnosis not present

## 2022-03-28 DIAGNOSIS — M9902 Segmental and somatic dysfunction of thoracic region: Secondary | ICD-10-CM | POA: Diagnosis not present

## 2022-03-28 DIAGNOSIS — M47816 Spondylosis without myelopathy or radiculopathy, lumbar region: Secondary | ICD-10-CM | POA: Diagnosis not present

## 2022-04-04 DIAGNOSIS — M47816 Spondylosis without myelopathy or radiculopathy, lumbar region: Secondary | ICD-10-CM | POA: Diagnosis not present

## 2022-04-04 DIAGNOSIS — M9901 Segmental and somatic dysfunction of cervical region: Secondary | ICD-10-CM | POA: Diagnosis not present

## 2022-04-04 DIAGNOSIS — M47812 Spondylosis without myelopathy or radiculopathy, cervical region: Secondary | ICD-10-CM | POA: Diagnosis not present

## 2022-04-04 DIAGNOSIS — M9903 Segmental and somatic dysfunction of lumbar region: Secondary | ICD-10-CM | POA: Diagnosis not present

## 2022-04-04 DIAGNOSIS — M9902 Segmental and somatic dysfunction of thoracic region: Secondary | ICD-10-CM | POA: Diagnosis not present

## 2022-04-04 DIAGNOSIS — M5441 Lumbago with sciatica, right side: Secondary | ICD-10-CM | POA: Diagnosis not present

## 2022-04-11 DIAGNOSIS — M9902 Segmental and somatic dysfunction of thoracic region: Secondary | ICD-10-CM | POA: Diagnosis not present

## 2022-04-11 DIAGNOSIS — M9903 Segmental and somatic dysfunction of lumbar region: Secondary | ICD-10-CM | POA: Diagnosis not present

## 2022-04-11 DIAGNOSIS — M47812 Spondylosis without myelopathy or radiculopathy, cervical region: Secondary | ICD-10-CM | POA: Diagnosis not present

## 2022-04-11 DIAGNOSIS — M5441 Lumbago with sciatica, right side: Secondary | ICD-10-CM | POA: Diagnosis not present

## 2022-04-11 DIAGNOSIS — M47816 Spondylosis without myelopathy or radiculopathy, lumbar region: Secondary | ICD-10-CM | POA: Diagnosis not present

## 2022-04-11 DIAGNOSIS — M9901 Segmental and somatic dysfunction of cervical region: Secondary | ICD-10-CM | POA: Diagnosis not present

## 2022-04-18 DIAGNOSIS — M9901 Segmental and somatic dysfunction of cervical region: Secondary | ICD-10-CM | POA: Diagnosis not present

## 2022-04-18 DIAGNOSIS — M9903 Segmental and somatic dysfunction of lumbar region: Secondary | ICD-10-CM | POA: Diagnosis not present

## 2022-04-18 DIAGNOSIS — M9902 Segmental and somatic dysfunction of thoracic region: Secondary | ICD-10-CM | POA: Diagnosis not present

## 2022-04-18 DIAGNOSIS — M47816 Spondylosis without myelopathy or radiculopathy, lumbar region: Secondary | ICD-10-CM | POA: Diagnosis not present

## 2022-04-18 DIAGNOSIS — M47812 Spondylosis without myelopathy or radiculopathy, cervical region: Secondary | ICD-10-CM | POA: Diagnosis not present

## 2022-04-18 DIAGNOSIS — M5441 Lumbago with sciatica, right side: Secondary | ICD-10-CM | POA: Diagnosis not present

## 2022-04-25 DIAGNOSIS — M9901 Segmental and somatic dysfunction of cervical region: Secondary | ICD-10-CM | POA: Diagnosis not present

## 2022-04-25 DIAGNOSIS — M47812 Spondylosis without myelopathy or radiculopathy, cervical region: Secondary | ICD-10-CM | POA: Diagnosis not present

## 2022-04-25 DIAGNOSIS — M5441 Lumbago with sciatica, right side: Secondary | ICD-10-CM | POA: Diagnosis not present

## 2022-04-25 DIAGNOSIS — M47816 Spondylosis without myelopathy or radiculopathy, lumbar region: Secondary | ICD-10-CM | POA: Diagnosis not present

## 2022-04-25 DIAGNOSIS — M9903 Segmental and somatic dysfunction of lumbar region: Secondary | ICD-10-CM | POA: Diagnosis not present

## 2022-04-25 DIAGNOSIS — M9902 Segmental and somatic dysfunction of thoracic region: Secondary | ICD-10-CM | POA: Diagnosis not present

## 2022-05-02 DIAGNOSIS — M47816 Spondylosis without myelopathy or radiculopathy, lumbar region: Secondary | ICD-10-CM | POA: Diagnosis not present

## 2022-05-02 DIAGNOSIS — M5441 Lumbago with sciatica, right side: Secondary | ICD-10-CM | POA: Diagnosis not present

## 2022-05-02 DIAGNOSIS — M9903 Segmental and somatic dysfunction of lumbar region: Secondary | ICD-10-CM | POA: Diagnosis not present

## 2022-05-02 DIAGNOSIS — M9901 Segmental and somatic dysfunction of cervical region: Secondary | ICD-10-CM | POA: Diagnosis not present

## 2022-05-02 DIAGNOSIS — M47812 Spondylosis without myelopathy or radiculopathy, cervical region: Secondary | ICD-10-CM | POA: Diagnosis not present

## 2022-05-02 DIAGNOSIS — M9902 Segmental and somatic dysfunction of thoracic region: Secondary | ICD-10-CM | POA: Diagnosis not present

## 2022-05-09 DIAGNOSIS — M5441 Lumbago with sciatica, right side: Secondary | ICD-10-CM | POA: Diagnosis not present

## 2022-05-09 DIAGNOSIS — M9901 Segmental and somatic dysfunction of cervical region: Secondary | ICD-10-CM | POA: Diagnosis not present

## 2022-05-09 DIAGNOSIS — M47812 Spondylosis without myelopathy or radiculopathy, cervical region: Secondary | ICD-10-CM | POA: Diagnosis not present

## 2022-05-09 DIAGNOSIS — M47816 Spondylosis without myelopathy or radiculopathy, lumbar region: Secondary | ICD-10-CM | POA: Diagnosis not present

## 2022-05-09 DIAGNOSIS — M9902 Segmental and somatic dysfunction of thoracic region: Secondary | ICD-10-CM | POA: Diagnosis not present

## 2022-05-09 DIAGNOSIS — M9903 Segmental and somatic dysfunction of lumbar region: Secondary | ICD-10-CM | POA: Diagnosis not present

## 2022-05-23 DIAGNOSIS — M5441 Lumbago with sciatica, right side: Secondary | ICD-10-CM | POA: Diagnosis not present

## 2022-05-23 DIAGNOSIS — M9903 Segmental and somatic dysfunction of lumbar region: Secondary | ICD-10-CM | POA: Diagnosis not present

## 2022-05-23 DIAGNOSIS — M9901 Segmental and somatic dysfunction of cervical region: Secondary | ICD-10-CM | POA: Diagnosis not present

## 2022-05-23 DIAGNOSIS — M47812 Spondylosis without myelopathy or radiculopathy, cervical region: Secondary | ICD-10-CM | POA: Diagnosis not present

## 2022-05-23 DIAGNOSIS — M47816 Spondylosis without myelopathy or radiculopathy, lumbar region: Secondary | ICD-10-CM | POA: Diagnosis not present

## 2022-05-23 DIAGNOSIS — M9902 Segmental and somatic dysfunction of thoracic region: Secondary | ICD-10-CM | POA: Diagnosis not present

## 2022-06-03 DIAGNOSIS — E559 Vitamin D deficiency, unspecified: Secondary | ICD-10-CM | POA: Diagnosis not present

## 2022-06-03 DIAGNOSIS — E7800 Pure hypercholesterolemia, unspecified: Secondary | ICD-10-CM | POA: Diagnosis not present

## 2022-06-03 DIAGNOSIS — Z1331 Encounter for screening for depression: Secondary | ICD-10-CM | POA: Diagnosis not present

## 2022-06-03 DIAGNOSIS — Z1339 Encounter for screening examination for other mental health and behavioral disorders: Secondary | ICD-10-CM | POA: Diagnosis not present

## 2022-06-03 DIAGNOSIS — Z6827 Body mass index (BMI) 27.0-27.9, adult: Secondary | ICD-10-CM | POA: Diagnosis not present

## 2022-06-03 DIAGNOSIS — R5383 Other fatigue: Secondary | ICD-10-CM | POA: Diagnosis not present

## 2022-06-03 DIAGNOSIS — Z299 Encounter for prophylactic measures, unspecified: Secondary | ICD-10-CM | POA: Diagnosis not present

## 2022-06-03 DIAGNOSIS — Z7189 Other specified counseling: Secondary | ICD-10-CM | POA: Diagnosis not present

## 2022-06-03 DIAGNOSIS — Z79899 Other long term (current) drug therapy: Secondary | ICD-10-CM | POA: Diagnosis not present

## 2022-06-03 DIAGNOSIS — Z0000 Encounter for general adult medical examination without abnormal findings: Secondary | ICD-10-CM | POA: Diagnosis not present

## 2022-06-03 DIAGNOSIS — F17210 Nicotine dependence, cigarettes, uncomplicated: Secondary | ICD-10-CM | POA: Diagnosis not present

## 2022-06-06 DIAGNOSIS — M5441 Lumbago with sciatica, right side: Secondary | ICD-10-CM | POA: Diagnosis not present

## 2022-06-06 DIAGNOSIS — M9901 Segmental and somatic dysfunction of cervical region: Secondary | ICD-10-CM | POA: Diagnosis not present

## 2022-06-06 DIAGNOSIS — M47812 Spondylosis without myelopathy or radiculopathy, cervical region: Secondary | ICD-10-CM | POA: Diagnosis not present

## 2022-06-06 DIAGNOSIS — M9903 Segmental and somatic dysfunction of lumbar region: Secondary | ICD-10-CM | POA: Diagnosis not present

## 2022-06-06 DIAGNOSIS — M9902 Segmental and somatic dysfunction of thoracic region: Secondary | ICD-10-CM | POA: Diagnosis not present

## 2022-06-06 DIAGNOSIS — M47816 Spondylosis without myelopathy or radiculopathy, lumbar region: Secondary | ICD-10-CM | POA: Diagnosis not present

## 2022-06-25 DIAGNOSIS — M9901 Segmental and somatic dysfunction of cervical region: Secondary | ICD-10-CM | POA: Diagnosis not present

## 2022-06-25 DIAGNOSIS — M9902 Segmental and somatic dysfunction of thoracic region: Secondary | ICD-10-CM | POA: Diagnosis not present

## 2022-06-25 DIAGNOSIS — M5441 Lumbago with sciatica, right side: Secondary | ICD-10-CM | POA: Diagnosis not present

## 2022-06-25 DIAGNOSIS — M9903 Segmental and somatic dysfunction of lumbar region: Secondary | ICD-10-CM | POA: Diagnosis not present

## 2022-06-25 DIAGNOSIS — M47816 Spondylosis without myelopathy or radiculopathy, lumbar region: Secondary | ICD-10-CM | POA: Diagnosis not present

## 2022-06-25 DIAGNOSIS — M47812 Spondylosis without myelopathy or radiculopathy, cervical region: Secondary | ICD-10-CM | POA: Diagnosis not present

## 2022-07-22 DIAGNOSIS — M5441 Lumbago with sciatica, right side: Secondary | ICD-10-CM | POA: Diagnosis not present

## 2022-07-22 DIAGNOSIS — M9903 Segmental and somatic dysfunction of lumbar region: Secondary | ICD-10-CM | POA: Diagnosis not present

## 2022-07-22 DIAGNOSIS — M9902 Segmental and somatic dysfunction of thoracic region: Secondary | ICD-10-CM | POA: Diagnosis not present

## 2022-07-22 DIAGNOSIS — M9901 Segmental and somatic dysfunction of cervical region: Secondary | ICD-10-CM | POA: Diagnosis not present

## 2022-07-22 DIAGNOSIS — M47816 Spondylosis without myelopathy or radiculopathy, lumbar region: Secondary | ICD-10-CM | POA: Diagnosis not present

## 2022-07-22 DIAGNOSIS — M47812 Spondylosis without myelopathy or radiculopathy, cervical region: Secondary | ICD-10-CM | POA: Diagnosis not present

## 2022-09-17 DIAGNOSIS — Z299 Encounter for prophylactic measures, unspecified: Secondary | ICD-10-CM | POA: Diagnosis not present

## 2022-09-17 DIAGNOSIS — U071 COVID-19: Secondary | ICD-10-CM | POA: Diagnosis not present

## 2022-09-17 DIAGNOSIS — R070 Pain in throat: Secondary | ICD-10-CM | POA: Diagnosis not present

## 2022-09-23 DIAGNOSIS — Z299 Encounter for prophylactic measures, unspecified: Secondary | ICD-10-CM | POA: Diagnosis not present

## 2022-09-23 DIAGNOSIS — R059 Cough, unspecified: Secondary | ICD-10-CM | POA: Diagnosis not present

## 2022-09-23 DIAGNOSIS — J320 Chronic maxillary sinusitis: Secondary | ICD-10-CM | POA: Diagnosis not present

## 2022-09-29 DIAGNOSIS — H40013 Open angle with borderline findings, low risk, bilateral: Secondary | ICD-10-CM | POA: Diagnosis not present

## 2022-10-16 DIAGNOSIS — Z299 Encounter for prophylactic measures, unspecified: Secondary | ICD-10-CM | POA: Diagnosis not present

## 2022-10-16 DIAGNOSIS — R000 Tachycardia, unspecified: Secondary | ICD-10-CM | POA: Diagnosis not present

## 2022-10-16 DIAGNOSIS — F17210 Nicotine dependence, cigarettes, uncomplicated: Secondary | ICD-10-CM | POA: Diagnosis not present

## 2022-10-16 DIAGNOSIS — J449 Chronic obstructive pulmonary disease, unspecified: Secondary | ICD-10-CM | POA: Diagnosis not present

## 2022-10-16 DIAGNOSIS — R002 Palpitations: Secondary | ICD-10-CM | POA: Diagnosis not present

## 2022-10-22 DIAGNOSIS — R002 Palpitations: Secondary | ICD-10-CM | POA: Diagnosis not present

## 2022-10-27 DIAGNOSIS — I4711 Inappropriate sinus tachycardia, so stated: Secondary | ICD-10-CM | POA: Diagnosis not present

## 2022-10-27 DIAGNOSIS — I471 Supraventricular tachycardia, unspecified: Secondary | ICD-10-CM | POA: Diagnosis not present

## 2022-10-29 DIAGNOSIS — Z299 Encounter for prophylactic measures, unspecified: Secondary | ICD-10-CM | POA: Diagnosis not present

## 2022-10-29 DIAGNOSIS — J449 Chronic obstructive pulmonary disease, unspecified: Secondary | ICD-10-CM | POA: Diagnosis not present

## 2022-10-29 DIAGNOSIS — E7800 Pure hypercholesterolemia, unspecified: Secondary | ICD-10-CM | POA: Diagnosis not present

## 2022-10-29 DIAGNOSIS — F17210 Nicotine dependence, cigarettes, uncomplicated: Secondary | ICD-10-CM | POA: Diagnosis not present

## 2022-10-29 DIAGNOSIS — I071 Rheumatic tricuspid insufficiency: Secondary | ICD-10-CM | POA: Diagnosis not present

## 2022-11-05 ENCOUNTER — Ambulatory Visit: Payer: Medicare Other | Attending: Internal Medicine | Admitting: Internal Medicine

## 2022-11-05 ENCOUNTER — Encounter: Payer: Self-pay | Admitting: Internal Medicine

## 2022-11-05 VITALS — BP 126/82 | HR 74 | Ht 64.5 in | Wt 159.0 lb

## 2022-11-05 DIAGNOSIS — R000 Tachycardia, unspecified: Secondary | ICD-10-CM | POA: Diagnosis not present

## 2022-11-05 DIAGNOSIS — R002 Palpitations: Secondary | ICD-10-CM | POA: Insufficient documentation

## 2022-11-05 DIAGNOSIS — I071 Rheumatic tricuspid insufficiency: Secondary | ICD-10-CM | POA: Insufficient documentation

## 2022-11-05 DIAGNOSIS — R079 Chest pain, unspecified: Secondary | ICD-10-CM | POA: Diagnosis not present

## 2022-11-05 DIAGNOSIS — Z720 Tobacco use: Secondary | ICD-10-CM | POA: Diagnosis not present

## 2022-11-05 MED ORDER — NITROGLYCERIN 0.4 MG SL SUBL: 0.4000 mg | SUBLINGUAL_TABLET | SUBLINGUAL | 3 refills | Status: AC | PRN

## 2022-11-05 MED ORDER — METOPROLOL TARTRATE 25 MG PO TABS: 25.0000 mg | ORAL_TABLET | Freq: Two times a day (BID) | ORAL | 1 refills | Status: DC

## 2022-11-05 MED ORDER — NITROGLYCERIN 0.4 MG SL SUBL: 0.4000 mg | SUBLINGUAL_TABLET | SUBLINGUAL | 3 refills | Status: DC | PRN

## 2022-11-05 NOTE — Patient Instructions (Addendum)
Medication Instructions:  Your physician has recommended you make the following change in your medication:  Start metoprolol tartrate 25 mg twice a day Start nitroglycerin 0.4 mg sublingual every 5 min times 3 doses as needed, if no relief after 3rd dose, call 911 or go to ED. Continue all other medications as directed.  Labwork: none  Testing/Procedures: Your physician has requested that you have en exercise stress myoview. For further information please visit [email or web address] Please follow instruction sheet, as given.   Follow-Up:  Your physician recommends that you schedule a follow-up appointment in: 3 months  Any Other Special Instructions Will Be Listed Below (If Applicable).  If you need a refill on your cardiac medications before your next appointment, please call your pharmacy.

## 2022-11-05 NOTE — Addendum Note (Signed)
Addended by: Delsa Sale on: 11/05/2022 11:47 AM   Modules accepted: Orders

## 2022-11-05 NOTE — Progress Notes (Addendum)
Cardiology Office Note  Date: 11/05/2022   ID: Suzanne Combs, DOB 19-Dec-1945, MRN [id number]  PCP:  Ignatius Specking, MD  Cardiologist:  Marjo Bicker, MD Electrophysiologist:  None   Reason for Office Visit: Evaluation chest pain at the request of Dr. Sherril Croon   History of Present Illness: Suzanne Combs is a 76 y.o. female known to have nicotine abuse was referred to cardiology clinic for evaluation chest pain at the request of Dr. Sherril Croon.  Patient started to have chest pain 3 months ago, located in the right side of her chest radiating to her right arm and left arm during exertion, lasting for 2 minutes, and relieved with rest. Frequency is once a week.  However she had an episode 1 month ago which lasted for a few hours and did not visit the ER as heater broke in her house and she did not take a bath. Last episode of chest pain was 1 week ago. She also has palpitations associated during these chest pain episodes for which she wore a heart monitor that was mailed out today and her PCP put her on metoprolol tartrate 20 mg as needed. Echo was also performed by her PCP which showed normal LVEF, severe TR and mild MR.  She never had any prior ischemic evaluation. No family history premature ASCVD.  She continues to smoke cigarettes, 26 cigarettes/day (tried to quit multiple times with no success), denied alcohol use or illicit drug abuse. Otherwise, she denied any other symptoms including DOE, leg swelling, dizziness and syncope. Patient also informed me that she was diagnosed with COPD during her palpitation/chest pain episodes, her oxygen saturation is noticed to be dropping.  Past Medical History:  Diagnosis Date   Arthritis    back, neck, and fingers   Chronic kidney disease    Left ureter obstruction   Complication of anesthesia    GERD (gastroesophageal reflux disease)    History of hiatal hernia    Neuromuscular disorder (HCC)    bulging disc in neck   PONV (postoperative  nausea and vomiting)     Past Surgical History:  Procedure Laterality Date   BIOPSY  03/22/2020   Procedure: BIOPSY;  Surgeon: Malissa Hippo, MD;  [facility];  Service: Endoscopy;;   CHOLECYSTECTOMY     COLONOSCOPY N/A 06/28/2015   Procedure: COLONOSCOPY;  Surgeon: Malissa Hippo, MD;  [facility];  Service: Endoscopy;  Laterality: N/A;  1100   COLONOSCOPY N/A 03/22/2020   Procedure: COLONOSCOPY;  Surgeon: Malissa Hippo, MD;  [facility];  Service: Endoscopy;  Laterality: N/A;   ESOPHAGOGASTRODUODENOSCOPY N/A 03/22/2020   Procedure: ESOPHAGOGASTRODUODENOSCOPY (EGD);  Surgeon: Malissa Hippo, MD;  [facility];  Service: Endoscopy;  Laterality: N/A;  830   EYE SURGERY Bilateral 2002   cataract   TONSILLECTOMY      Current Outpatient Medications  Medication Sig Dispense Refill   Ascorbic Acid (VITAMIN C) 1000 MG tablet Take 1,000 mg by mouth daily at 12 noon.      calcium carbonate (TUMS - DOSED IN MG ELEMENTAL CALCIUM) 500 MG chewable tablet Chew 1 tablet by mouth daily as needed for indigestion or heartburn.     famotidine (PEPCID AC MAXIMUM STRENGTH) 20 MG tablet Take 20 mg by mouth daily at 12 noon.      ibuprofen (ADVIL) 200 MG tablet Take 200 mg by mouth every 8 (eight) hours as needed for moderate pain.  loperamide (IMODIUM A-D) 2 MG tablet Take 0.5 tablets (1 mg total) by mouth daily. (Patient taking differently: Take 1 mg by mouth 3 (three) times daily as needed for diarrhea or loose stools.) 30 tablet 0   loratadine (CLARITIN) 5 MG chewable tablet Chew 5 mg by mouth daily.     metoprolol tartrate (LOPRESSOR) 25 MG tablet Take 25 mg by mouth daily as needed.     Polyvinyl Alcohol-Povidone (REFRESH OP) Place 1 drop into both eyes daily as needed (dry eyes).     Vitamin D, Ergocalciferol, (DRISDOL) 1.25 MG (50000 UNIT) CAPS capsule Take 50,000 Units by mouth every Sunday.     No current facility-administered medications for this  visit.   Allergies:  Dicyclomine, Belladonna alkaloids, Ciprocin-fluocin-procin [fluocinolone], Myrbetriq [mirabegron], Prednisone, Quinolones, and Vesicare [solifenacin]   Social History: The patient  reports that she has been smoking cigarettes. She has a 40.00 pack-year smoking history. She has never used smokeless tobacco. She reports current alcohol use. She reports that she does not use drugs.   Family History: The patient's family history includes Arthritis in her father and mother; Colon cancer (age of onset: 78) in her cousin; Colon cancer (age of onset: 37) in her paternal uncle; Dementia in her mother; Diverticulosis in her sister; Glaucoma in her sister; Hypertension in her brother, father, mother, and sister; Prostate cancer in her father; Stroke in her mother.   ROS:  Please see the history of present illness. Otherwise, complete review of systems is positive for none.  All other systems are reviewed and negative.   Physical Exam: VS:  BP 126/82   Pulse 74   Ht 5' 4.5" (1.638 m)   Wt 159 lb (72.1 kg)   SpO2 97%   BMI 26.87 kg/m , BMI Body mass index is 26.87 kg/m.  Wt Readings from Last 3 Encounters:  11/05/22 159 lb (72.1 kg)  03/22/20 162 lb (73.5 kg)  01/30/20 161 lb 8 oz (73.3 kg)    General: Patient appears comfortable at rest. HEENT: Conjunctiva and lids normal, oropharynx clear with moist mucosa. Neck: Supple, no elevated JVP or carotid bruits, no thyromegaly. Lungs: Clear to auscultation, nonlabored breathing at rest. Cardiac: Regular rate and rhythm, no S3 or significant systolic murmur, no pericardial rub. Abdomen: Soft, nontender, no hepatomegaly, bowel sounds present, no guarding or rebound. Extremities: No pitting edema, distal pulses 2+. Skin: Warm and dry. Musculoskeletal: No kyphosis. Neuropsychiatric: Alert and oriented x3, affect grossly appropriate.  ECG:  An ECG dated 11/05/2022 was personally reviewed today and demonstrated:  Normal sinus rhythm,  T wave inversions in the inferior and lateral leads, could be nonspecific  Recent Labwork: No results found for requested labs within last 365 days.  No results found for: "CHOL", "TRIG", "HDL", "CHOLHDL", "VLDL", "LDLCALC", "LDLDIRECT"  Other Studies Reviewed Today: I personally reviewed the heart care referral records from the PCP including the echocardiogram which showed normal LVEF, severe TR and mild MR.  Assessment and Plan: Patient is a 76 year old F known to have nicotine abuse was referred to cardiology clinic for evaluation of chest pain at the request of Dr. Sherril Croon.  # Cardiac chest pain -Start metoprolol tartrate 20 mg twice daily and SL NTG 0.4 mg as needed for breakthrough chest pains. Obtain exercise Myoview.  # Palpitations -Patient already wore a heart monitor which was mailed out today to the company.  PCP to fax the heart monitor report to the cardiology clinic.  # Severe tricuspid regurgitation -Patient has severe TR  on echo performed this month. Denied any DOE or leg swelling.  Will monitor for any symptoms and start diuretics if needed.  # Nicotine abuse -Smoking cessation counseling provided. Patient will try to cut down cigarette smoking.  I have spent a total of 45 minutes with patient reviewing chart, EKGs, labs and examining patient as well as establishing an assessment and plan that was discussed with the patient.  > 50% of time was spent in direct patient care.     Medication Adjustments/Labs and Tests Ordered: Current medicines are reviewed at length with the patient today.  Concerns regarding medicines are outlined above.   Tests Ordered: Orders Placed This Encounter  Procedures   EKG 12-Lead    Medication Changes: No orders of the defined types were placed in this encounter.   Disposition:  Follow up  3 months  Signed Doneshia Hill Verne Spurr, MD, 11/05/2022 11:07 AM    [facility] at [facility] [address] [place], [place],  [place] [address]

## 2022-11-11 ENCOUNTER — Telehealth: Payer: Self-pay | Admitting: Internal Medicine
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
## 2022-11-11 NOTE — Telephone Encounter (Signed)
Patient stated she would like to switch her treadmill stress test to a chemical test.

## 2022-11-12 ENCOUNTER — Telehealth: Payer: Self-pay | Admitting: Internal Medicine

## 2022-11-12 NOTE — Telephone Encounter (Signed)
Checking percert on the following patient for testing scheduled at [facility].    LEXISCAN  11/19/2022

## 2022-11-12 NOTE — Telephone Encounter (Signed)
Sent to [facility] to change to lexiscan from exercise stress test, per Dr. Jenene Slicker.

## 2022-11-13 DIAGNOSIS — R002 Palpitations: Secondary | ICD-10-CM | POA: Diagnosis not present

## 2022-11-19 ENCOUNTER — Encounter (HOSPITAL_COMMUNITY): Payer: Self-pay

## 2022-11-19 ENCOUNTER — Ambulatory Visit (HOSPITAL_COMMUNITY)
Admission: RE | Admit: 2022-11-19 | Discharge: 2022-11-19 | Disposition: A | Discharge status: Home / Self Care | Payer: Medicare Other | Source: Ambulatory Visit | Attending: Internal Medicine | Admitting: Internal Medicine

## 2022-11-19 DIAGNOSIS — R000 Tachycardia, unspecified: Secondary | ICD-10-CM | POA: Insufficient documentation

## 2022-11-19 DIAGNOSIS — R079 Chest pain, unspecified: Secondary | ICD-10-CM | POA: Diagnosis not present

## 2022-11-19 LAB — NM MYOCAR MULTI W/SPECT W/WALL MOTION / EF
LV dias vol: 73 mL (ref 46–106)
LV sys vol: 18 mL
Nuc Stress EF: 76 %
Peak HR: 80 {beats}/min
RATE: 0.4
Rest HR: 55 {beats}/min
Rest Nuclear Isotope Dose: 11 mCi
SDS: 2
SRS: 1
SSS: 3
ST Depression (mm): 0 mm
Stress Nuclear Isotope Dose: 32.3 mCi
T Wave inversion (mm): 0.5 mm
TID: 1.06

## 2022-11-19 MED ORDER — TECHNETIUM TC 99M TETROFOSMIN IV KIT
10.0000 | PACK | Freq: Once | INTRAVENOUS | Status: AC | PRN
  Administered 2022-11-19: 11 via INTRAVENOUS

## 2022-11-19 MED ORDER — TECHNETIUM TC 99M TETROFOSMIN IV KIT
30.0000 | PACK | Freq: Once | INTRAVENOUS | Status: AC | PRN
  Administered 2022-11-19: 32.3 via INTRAVENOUS

## 2022-11-19 MED ORDER — SODIUM CHLORIDE FLUSH 0.9 % IV SOLN
INTRAVENOUS | Status: AC
  Filled 2022-11-19: qty 10

## 2022-11-19 MED ORDER — REGADENOSON 0.4 MG/5ML IV SOLN
INTRAVENOUS | Status: AC
  Administered 2022-11-19: 0.4 mg
  Filled 2022-11-19: qty 5

## 2022-11-25 DIAGNOSIS — R002 Palpitations: Secondary | ICD-10-CM | POA: Diagnosis not present

## 2022-11-27 DIAGNOSIS — R5383 Other fatigue: Secondary | ICD-10-CM | POA: Diagnosis not present

## 2022-11-27 DIAGNOSIS — I4891 Unspecified atrial fibrillation: Secondary | ICD-10-CM | POA: Diagnosis not present

## 2022-11-27 DIAGNOSIS — J449 Chronic obstructive pulmonary disease, unspecified: Secondary | ICD-10-CM | POA: Diagnosis not present

## 2022-11-27 DIAGNOSIS — Z299 Encounter for prophylactic measures, unspecified: Secondary | ICD-10-CM | POA: Diagnosis not present

## 2022-12-11 DIAGNOSIS — Z299 Encounter for prophylactic measures, unspecified: Secondary | ICD-10-CM | POA: Diagnosis not present

## 2022-12-11 DIAGNOSIS — F17210 Nicotine dependence, cigarettes, uncomplicated: Secondary | ICD-10-CM | POA: Diagnosis not present

## 2022-12-11 DIAGNOSIS — R059 Cough, unspecified: Secondary | ICD-10-CM | POA: Diagnosis not present

## 2022-12-11 DIAGNOSIS — J111 Influenza due to unidentified influenza virus with other respiratory manifestations: Secondary | ICD-10-CM | POA: Diagnosis not present

## 2023-01-01 ENCOUNTER — Other Ambulatory Visit: Payer: Self-pay | Admitting: *Deleted

## 2023-01-01 MED ORDER — METOPROLOL TARTRATE 25 MG PO TABS: 25.0000 mg | ORAL_TABLET | Freq: Two times a day (BID) | ORAL | 1 refills | Status: DC

## 2023-01-16 DIAGNOSIS — F17210 Nicotine dependence, cigarettes, uncomplicated: Secondary | ICD-10-CM | POA: Diagnosis not present

## 2023-01-16 DIAGNOSIS — D6869 Other thrombophilia: Secondary | ICD-10-CM | POA: Diagnosis not present

## 2023-01-16 DIAGNOSIS — J069 Acute upper respiratory infection, unspecified: Secondary | ICD-10-CM | POA: Diagnosis not present

## 2023-01-16 DIAGNOSIS — Z299 Encounter for prophylactic measures, unspecified: Secondary | ICD-10-CM | POA: Diagnosis not present

## 2023-01-16 DIAGNOSIS — R059 Cough, unspecified: Secondary | ICD-10-CM | POA: Diagnosis not present

## 2023-01-28 DIAGNOSIS — I4891 Unspecified atrial fibrillation: Secondary | ICD-10-CM | POA: Diagnosis not present

## 2023-01-28 DIAGNOSIS — F17210 Nicotine dependence, cigarettes, uncomplicated: Secondary | ICD-10-CM | POA: Diagnosis not present

## 2023-01-28 DIAGNOSIS — Z299 Encounter for prophylactic measures, unspecified: Secondary | ICD-10-CM | POA: Diagnosis not present

## 2023-01-28 DIAGNOSIS — D6869 Other thrombophilia: Secondary | ICD-10-CM | POA: Diagnosis not present

## 2023-02-09 ENCOUNTER — Encounter: Payer: Self-pay | Admitting: *Deleted

## 2023-02-09 ENCOUNTER — Encounter: Payer: Self-pay | Admitting: Internal Medicine

## 2023-02-09 ENCOUNTER — Ambulatory Visit: Payer: Medicare Other | Attending: Internal Medicine | Admitting: Internal Medicine

## 2023-02-09 VITALS — BP 138/80 | HR 56 | Ht 64.5 in | Wt 156.0 lb

## 2023-02-09 DIAGNOSIS — I071 Rheumatic tricuspid insufficiency: Secondary | ICD-10-CM | POA: Diagnosis not present

## 2023-02-09 NOTE — Progress Notes (Signed)
Cardiology Office Note  Date: 02/09/2023   ID: Yatana, Pipe [birth year], MRN [id number]  PCP:  Glenda Chroman, MD  Cardiologist:  Chalmers Guest, MD Electrophysiologist:  None   Reason for Office Visit: Follow-up of palpitations   History of Present Illness: Suzanne Combs is a 77 y.o. female known to have nicotine abuse presented to cardiology clinic for follow-up of palpitations.  Patient was referred to cardiology clinic in 10/2022 for evaluation of palpitations and chest pain. Chest pain initially started around 08/2022, located in the right side of her chest rating to her right arm and left arm during exertion, lasting for 2 minutes and relieved with rest. Frequency has been once per week. Due to possible cardiac chest pain, exercise Myoview was obtained which showed no evidence of ischemia. She also had palpitations for which she wore event monitor and results will need to be obtained from the PCP.  She presents today for follow-up visit.  She does not have prior quality of chest pain but does have a new quality of chest discomfort that occurs at rest and resolves with changing position. No chest pain with exertion. She had a event monitor resulted at her PCPs office which showed atrial fibrillation and was started on Eliquis 5 mg twice daily in addition to metoprolol tartrate 25 mg twice daily. Denies any SOB, syncope, claudication, leg swelling symptoms. She was smoking 26 cigarettes/day and cut down to 23 cigarettes/day (cutting down 1 cigarette/month over the last 3 months).  No family history of premature ASCVD.  She was also diagnosed with COPD during her palpitations/chest pain episodes, her oxygen saturation was noted to be dropping.  Patient reported she was taking a beet in the past but after discontinuing beet, she noticed her palpitations also resolved.  She said it is a herbal supplement.  Past Medical History:  Diagnosis Date   Arthritis    back, neck, and  fingers   Chronic kidney disease    Left ureter obstruction   Complication of anesthesia    GERD (gastroesophageal reflux disease)    History of hiatal hernia    Neuromuscular disorder (HCC)    bulging disc in neck   PONV (postoperative nausea and vomiting)     Past Surgical History:  Procedure Laterality Date   BIOPSY  03/22/2020   Procedure: BIOPSY;  Surgeon: Rogene Houston, MD;  [facility];  Service: Endoscopy;;   CHOLECYSTECTOMY     COLONOSCOPY N/A 06/28/2015   Procedure: COLONOSCOPY;  Surgeon: Rogene Houston, MD;  [facility];  Service: Endoscopy;  Laterality: N/A;  1100   COLONOSCOPY N/A 03/22/2020   Procedure: COLONOSCOPY;  Surgeon: Rogene Houston, MD;  [facility];  Service: Endoscopy;  Laterality: N/A;   ESOPHAGOGASTRODUODENOSCOPY N/A 03/22/2020   Procedure: ESOPHAGOGASTRODUODENOSCOPY (EGD);  Surgeon: Rogene Houston, MD;  [facility];  Service: Endoscopy;  Laterality: N/A;  830   EYE SURGERY Bilateral 2002   cataract   TONSILLECTOMY      Current Outpatient Medications  Medication Sig Dispense Refill   Ascorbic Acid (VITAMIN C) 1000 MG tablet Take 2,000 mg by mouth daily at 12 noon.     calcium carbonate (CALCIUM 600) 600 MG TABS tablet Take 600 mg by mouth daily with breakfast.     calcium carbonate (TUMS - DOSED IN MG ELEMENTAL CALCIUM) 500 MG chewable tablet Chew 1 tablet by mouth daily as needed for indigestion or heartburn.  famotidine (PEPCID AC MAXIMUM STRENGTH) 20 MG tablet Take 20 mg by mouth daily at 12 noon.      loperamide (IMODIUM A-D) 2 MG tablet Take 0.5 tablets (1 mg total) by mouth daily. (Patient taking differently: Take 1 mg by mouth 3 (three) times daily as needed for diarrhea or loose stools.) 30 tablet 0   loratadine (CLARITIN) 5 MG chewable tablet Chew 5 mg by mouth daily.     metoprolol tartrate (LOPRESSOR) 25 MG tablet Take 1 tablet (25 mg total) by mouth 2 (two) times daily. 180 tablet 1    nitroGLYCERIN (NITROSTAT) 0.4 MG SL tablet Place 1 tablet (0.4 mg total) under the tongue every 5 (five) minutes x 3 doses as needed for chest pain (If no relief after 3rd dose, call 911 or go to ED.). 90 tablet 3   Polyvinyl Alcohol-Povidone (REFRESH OP) Place 1 drop into both eyes daily as needed (dry eyes).     ibuprofen (ADVIL) 200 MG tablet Take 200 mg by mouth every 8 (eight) hours as needed for moderate pain. (Patient not taking: Reported on 02/09/2023)     No current facility-administered medications for this visit.   Allergies:  Dicyclomine, Belladonna alkaloids, Ciprocin-fluocin-procin [fluocinolone], Myrbetriq [mirabegron], Prednisone, Quinolones, and Vesicare [solifenacin]   Social History: The patient  reports that she has been smoking cigarettes. She has a 40.00 pack-year smoking history. She has never used smokeless tobacco. She reports current alcohol use. She reports that she does not use drugs.   Family History: The patient's family history includes Arthritis in her father and mother; Colon cancer (age of onset: 39) in her cousin; Colon cancer (age of onset: 20) in her paternal uncle; Dementia in her mother; Diverticulosis in her sister; Glaucoma in her sister; Hypertension in her brother, father, mother, and sister; Prostate cancer in her father; Stroke in her mother.   ROS:  Please see the history of present illness. Otherwise, complete review of systems is positive for none.  All other systems are reviewed and negative.   Physical Exam: VS:  Ht 5' 4.5" (1.638 m)   Wt 156 lb (70.8 kg)   BMI 26.36 kg/m , BMI Body mass index is 26.36 kg/m.  Wt Readings from Last 3 Encounters:  02/09/23 156 lb (70.8 kg)  11/05/22 159 lb (72.1 kg)  03/22/20 162 lb (73.5 kg)    General: Patient appears comfortable at rest. HEENT: Conjunctiva and lids normal, oropharynx clear with moist mucosa. Neck: Supple, no elevated JVP or carotid bruits, no thyromegaly. Lungs: Clear to auscultation,  nonlabored breathing at rest. Cardiac: Regular rate and rhythm, no S3 or significant systolic murmur, no pericardial rub. Abdomen: Soft, nontender, no hepatomegaly, bowel sounds present, no guarding or rebound. Extremities: No pitting edema, distal pulses 2+. Skin: Warm and dry. Musculoskeletal: No kyphosis. Neuropsychiatric: Alert and oriented x3, affect grossly appropriate.  ECG:  An ECG dated 11/05/2022 was personally reviewed today and demonstrated:  Normal sinus rhythm, T wave inversions in the inferior and lateral leads, could be nonspecific  Recent Labwork: No results found for requested labs within last 365 days.  No results found for: "CHOL", "TRIG", "HDL", "CHOLHDL", "VLDL", "LDLCALC", "LDLDIRECT"  Other Studies Reviewed Today: I personally reviewed the heart care referral records from the PCP including the echocardiogram which showed normal LVEF, severe TR and mild MR.  Assessment and Plan: Patient is a 77 year old F known to have nicotine abuse presented to cardiology clinic for follow-up of palpitations.  # Chest pain -Patient had cardiac  quality of chest pain in 10/2022 which resolved and patient currently has new quality of chest pain, which is noncardiac in etiology. Exercise Myoview in 10/2022 was negative for ischemia.  # Palpitations likely secondary to atrial fibrillation -Patient had a event monitor in 10/2022 at her PCPs office and it showed atrial fibrillation, per the patient.  I do not have any records and will obtain/request records from PCP. -Continue metoprolol tartrate 25 mg twice daily and Eliquis 5 mg twice daily -Patient also reported that her palpitations resolved after discontinuing the herbal supplement, beet  # Severe tricuspid regurgitation per 10/2022 echocardiogram -Patient has severe TR on echo performed in 10/2022 at her PCPs office. Denies any symptoms of DOE or leg swelling.  Physical examination negative for any grade 3/6 systolic murmur. Will  obtain echocardiogram in 6 months prior to next clinic visit.  # Nicotine abuse -Patient has cut down smoking cigarettes from 26/day to 23/day (cutting down 1 cigarette/month in the last 3 months).  Smoking cessation counseling provided. Smoking cessation instruction/counseling given:  counseled patient on the dangers of tobacco use, advised patient to stop smoking, and reviewed strategies to maximize success   I have spent a total of 33 minutes with patient reviewing chart, EKGs, labs and examining patient as well as establishing an assessment and plan that was discussed with the patient.  > 50% of time was spent in direct patient care.     Medication Adjustments/Labs and Tests Ordered: Current medicines are reviewed at length with the patient today.  Concerns regarding medicines are outlined above.   Tests Ordered: No orders of the defined types were placed in this encounter.   Medication Changes: No orders of the defined types were placed in this encounter.   Disposition:  Follow up  3 months  Signed Kino Dunsworth Fidel Levy, MD, 02/09/2023 2:52 PM    [facility] at [place], [place],  [address]

## 2023-02-09 NOTE — Patient Instructions (Addendum)
Medication Instructions:  Your physician recommends that you continue on your current medications as directed. Please refer to the Current Medication list given to you today.  Labwork: none  Testing/Procedures: Your physician has requested that you have an echocardiogram in 6 months just before your next visit. Echocardiography is a painless test that uses sound waves to create images of your heart. It provides your doctor with information about the size and shape of your heart and how well your heart's chambers and valves are working. This procedure takes approximately one hour. There are no restrictions for this procedure. Please do NOT wear cologne, perfume, aftershave, or lotions (deodorant is allowed). Please arrive 15 minutes prior to your appointment time.  Follow-Up: Your physician recommends that you schedule a follow-up appointment in: 6 months  Any Other Special Instructions Will Be Listed Below (If Applicable).  If you need a refill on your cardiac medications before your next appointment, please call your pharmacy.

## 2023-02-23 DIAGNOSIS — Z1231 Encounter for screening mammogram for malignant neoplasm of breast: Secondary | ICD-10-CM | POA: Diagnosis not present

## 2023-03-10 DIAGNOSIS — Z299 Encounter for prophylactic measures, unspecified: Secondary | ICD-10-CM | POA: Diagnosis not present

## 2023-03-10 DIAGNOSIS — J449 Chronic obstructive pulmonary disease, unspecified: Secondary | ICD-10-CM | POA: Diagnosis not present

## 2023-03-10 DIAGNOSIS — R309 Painful micturition, unspecified: Secondary | ICD-10-CM | POA: Diagnosis not present

## 2023-03-10 DIAGNOSIS — K5792 Diverticulitis of intestine, part unspecified, without perforation or abscess without bleeding: Secondary | ICD-10-CM | POA: Diagnosis not present

## 2023-03-10 DIAGNOSIS — D6869 Other thrombophilia: Secondary | ICD-10-CM | POA: Diagnosis not present

## 2023-05-04 DIAGNOSIS — D6869 Other thrombophilia: Secondary | ICD-10-CM | POA: Diagnosis not present

## 2023-05-04 DIAGNOSIS — Z299 Encounter for prophylactic measures, unspecified: Secondary | ICD-10-CM | POA: Diagnosis not present

## 2023-05-04 DIAGNOSIS — F17210 Nicotine dependence, cigarettes, uncomplicated: Secondary | ICD-10-CM | POA: Diagnosis not present

## 2023-05-04 DIAGNOSIS — I4891 Unspecified atrial fibrillation: Secondary | ICD-10-CM | POA: Diagnosis not present

## 2023-05-04 DIAGNOSIS — J0100 Acute maxillary sinusitis, unspecified: Secondary | ICD-10-CM | POA: Diagnosis not present

## 2023-05-04 DIAGNOSIS — J441 Chronic obstructive pulmonary disease with (acute) exacerbation: Secondary | ICD-10-CM | POA: Diagnosis not present

## 2023-05-18 DIAGNOSIS — J449 Chronic obstructive pulmonary disease, unspecified: Secondary | ICD-10-CM | POA: Diagnosis not present

## 2023-05-18 DIAGNOSIS — J320 Chronic maxillary sinusitis: Secondary | ICD-10-CM | POA: Diagnosis not present

## 2023-05-18 DIAGNOSIS — I4891 Unspecified atrial fibrillation: Secondary | ICD-10-CM | POA: Diagnosis not present

## 2023-05-18 DIAGNOSIS — F17210 Nicotine dependence, cigarettes, uncomplicated: Secondary | ICD-10-CM | POA: Diagnosis not present

## 2023-05-18 DIAGNOSIS — Z299 Encounter for prophylactic measures, unspecified: Secondary | ICD-10-CM | POA: Diagnosis not present

## 2023-06-01 ENCOUNTER — Other Ambulatory Visit: Payer: Self-pay | Admitting: Internal Medicine

## 2023-06-10 DIAGNOSIS — E7800 Pure hypercholesterolemia, unspecified: Secondary | ICD-10-CM | POA: Diagnosis not present

## 2023-06-10 DIAGNOSIS — Z0000 Encounter for general adult medical examination without abnormal findings: Secondary | ICD-10-CM | POA: Diagnosis not present

## 2023-06-10 DIAGNOSIS — E559 Vitamin D deficiency, unspecified: Secondary | ICD-10-CM | POA: Diagnosis not present

## 2023-06-10 DIAGNOSIS — F17210 Nicotine dependence, cigarettes, uncomplicated: Secondary | ICD-10-CM | POA: Diagnosis not present

## 2023-06-10 DIAGNOSIS — M8580 Other specified disorders of bone density and structure, unspecified site: Secondary | ICD-10-CM | POA: Diagnosis not present

## 2023-06-10 DIAGNOSIS — R5383 Other fatigue: Secondary | ICD-10-CM | POA: Diagnosis not present

## 2023-06-10 DIAGNOSIS — Z1339 Encounter for screening examination for other mental health and behavioral disorders: Secondary | ICD-10-CM | POA: Diagnosis not present

## 2023-06-10 DIAGNOSIS — Z1331 Encounter for screening for depression: Secondary | ICD-10-CM | POA: Diagnosis not present

## 2023-06-10 DIAGNOSIS — Z7189 Other specified counseling: Secondary | ICD-10-CM | POA: Diagnosis not present

## 2023-06-10 DIAGNOSIS — Z79899 Other long term (current) drug therapy: Secondary | ICD-10-CM | POA: Diagnosis not present

## 2023-06-10 DIAGNOSIS — Z299 Encounter for prophylactic measures, unspecified: Secondary | ICD-10-CM | POA: Diagnosis not present

## 2023-06-29 DIAGNOSIS — D6869 Other thrombophilia: Secondary | ICD-10-CM | POA: Diagnosis not present

## 2023-06-29 DIAGNOSIS — I4891 Unspecified atrial fibrillation: Secondary | ICD-10-CM | POA: Diagnosis not present

## 2023-06-29 DIAGNOSIS — J441 Chronic obstructive pulmonary disease with (acute) exacerbation: Secondary | ICD-10-CM | POA: Diagnosis not present

## 2023-06-29 DIAGNOSIS — Z299 Encounter for prophylactic measures, unspecified: Secondary | ICD-10-CM | POA: Diagnosis not present

## 2023-06-29 DIAGNOSIS — H6691 Otitis media, unspecified, right ear: Secondary | ICD-10-CM | POA: Diagnosis not present

## 2023-06-29 DIAGNOSIS — F17210 Nicotine dependence, cigarettes, uncomplicated: Secondary | ICD-10-CM | POA: Diagnosis not present

## 2023-07-23 ENCOUNTER — Ambulatory Visit: Payer: Medicare Other | Attending: Internal Medicine

## 2023-07-23 DIAGNOSIS — I071 Rheumatic tricuspid insufficiency: Secondary | ICD-10-CM

## 2023-07-23 LAB — ECHOCARDIOGRAM COMPLETE
AR max vel: 2.72 cm2
AV Area VTI: 2.87 cm2
AV Area mean vel: 3.02 cm2
AV Mean grad: 2.3 mmHg
AV Peak grad: 5.6 mmHg
Ao pk vel: 1.19 m/s
Area-P 1/2: 3.72 cm2
Calc EF: 65.2 %
MV M vel: 3.21 m/s
MV Peak grad: 41.2 mmHg
S' Lateral: 3 cm
Single Plane A2C EF: 66.2 %
Single Plane A4C EF: 61.7 %

## 2023-08-10 ENCOUNTER — Other Ambulatory Visit: Payer: Medicare Other

## 2023-08-12 ENCOUNTER — Encounter: Payer: Medicare Other | Admitting: Internal Medicine

## 2023-08-12 NOTE — Progress Notes (Signed)
Erroneous encounter - please disregard.

## 2023-10-01 DIAGNOSIS — H40013 Open angle with borderline findings, low risk, bilateral: Secondary | ICD-10-CM | POA: Diagnosis not present

## 2023-10-08 DIAGNOSIS — I4891 Unspecified atrial fibrillation: Secondary | ICD-10-CM | POA: Diagnosis not present

## 2023-10-08 DIAGNOSIS — J069 Acute upper respiratory infection, unspecified: Secondary | ICD-10-CM | POA: Diagnosis not present

## 2023-10-08 DIAGNOSIS — J441 Chronic obstructive pulmonary disease with (acute) exacerbation: Secondary | ICD-10-CM | POA: Diagnosis not present

## 2023-10-08 DIAGNOSIS — J3489 Other specified disorders of nose and nasal sinuses: Secondary | ICD-10-CM | POA: Diagnosis not present

## 2023-10-08 DIAGNOSIS — Z299 Encounter for prophylactic measures, unspecified: Secondary | ICD-10-CM | POA: Diagnosis not present

## 2023-10-16 DIAGNOSIS — Z299 Encounter for prophylactic measures, unspecified: Secondary | ICD-10-CM | POA: Diagnosis not present

## 2023-10-16 DIAGNOSIS — J209 Acute bronchitis, unspecified: Secondary | ICD-10-CM | POA: Diagnosis not present

## 2023-10-16 DIAGNOSIS — F17210 Nicotine dependence, cigarettes, uncomplicated: Secondary | ICD-10-CM | POA: Diagnosis not present

## 2023-10-16 DIAGNOSIS — J440 Chronic obstructive pulmonary disease with acute lower respiratory infection: Secondary | ICD-10-CM | POA: Diagnosis not present

## 2023-11-11 DIAGNOSIS — J069 Acute upper respiratory infection, unspecified: Secondary | ICD-10-CM | POA: Diagnosis not present

## 2023-11-11 DIAGNOSIS — J449 Chronic obstructive pulmonary disease, unspecified: Secondary | ICD-10-CM | POA: Diagnosis not present

## 2023-11-11 DIAGNOSIS — Z299 Encounter for prophylactic measures, unspecified: Secondary | ICD-10-CM | POA: Diagnosis not present

## 2023-11-11 DIAGNOSIS — R0981 Nasal congestion: Secondary | ICD-10-CM | POA: Diagnosis not present

## 2024-02-02 ENCOUNTER — Encounter: Payer: Self-pay | Admitting: Internal Medicine

## 2024-02-02 ENCOUNTER — Ambulatory Visit: Payer: Medicare Other | Attending: Internal Medicine | Admitting: Internal Medicine

## 2024-02-02 VITALS — BP 118/72 | HR 54 | Ht 64.5 in | Wt 151.2 lb

## 2024-02-02 DIAGNOSIS — R002 Palpitations: Secondary | ICD-10-CM | POA: Insufficient documentation

## 2024-02-02 DIAGNOSIS — R000 Tachycardia, unspecified: Secondary | ICD-10-CM | POA: Diagnosis not present

## 2024-02-02 DIAGNOSIS — I480 Paroxysmal atrial fibrillation: Secondary | ICD-10-CM | POA: Insufficient documentation

## 2024-02-02 NOTE — Progress Notes (Signed)
Cardiology Office Note  Date: 02/02/2024   ID: Suzanne MCRAY, DOB 05-02-1946, MRN [id number]  PCP:  Ignatius Specking, MD  Cardiologist:  Marjo Bicker, MD Electrophysiologist:  None   Reason for Office Visit: Follow-up of palpitations   History of Present Illness: Suzanne Combs is a 78 y.o. female known to have paroxysmal A-fib, nicotine abuse is here for follow-up visit.  Initially referred for chest pain and palpitations. Exercise Myoview in 2023 showed no evidence of ischemia.  I reviewed the event monitor report from the PCPs office that showed less than 1% atrial fibrillation, longest episode lasting for 6 minutes and 3 seconds.  Currently on metoprolol and Eliquis.  She does have intermittent palpitations but it lasted only for a few seconds.  No angina, DOE, dizziness, syncope or leg swelling.  Past Medical History:  Diagnosis Date   Arthritis    back, neck, and fingers   Chronic kidney disease    Left ureter obstruction   Complication of anesthesia    GERD (gastroesophageal reflux disease)    History of hiatal hernia    Neuromuscular disorder (HCC)    bulging disc in neck   PONV (postoperative nausea and vomiting)     Past Surgical History:  Procedure Laterality Date   BIOPSY  03/22/2020   Procedure: BIOPSY;  Surgeon: Malissa Hippo, MD;  [facility];  Service: Endoscopy;;   CHOLECYSTECTOMY     COLONOSCOPY N/A 06/28/2015   Procedure: COLONOSCOPY;  Surgeon: Malissa Hippo, MD;  [facility];  Service: Endoscopy;  Laterality: N/A;  1100   COLONOSCOPY N/A 03/22/2020   Procedure: COLONOSCOPY;  Surgeon: Malissa Hippo, MD;  [facility];  Service: Endoscopy;  Laterality: N/A;   ESOPHAGOGASTRODUODENOSCOPY N/A 03/22/2020   Procedure: ESOPHAGOGASTRODUODENOSCOPY (EGD);  Surgeon: Malissa Hippo, MD;  [facility];  Service: Endoscopy;  Laterality: N/A;  830   EYE SURGERY Bilateral 2002   cataract   TONSILLECTOMY       Current Outpatient Medications  Medication Sig Dispense Refill   Ascorbic Acid (VITAMIN C) 1000 MG tablet Take 2,000 mg by mouth daily at 12 noon.     calcium carbonate (CALCIUM 600) 600 MG TABS tablet Take 600 mg by mouth daily with breakfast.     calcium carbonate (TUMS - DOSED IN MG ELEMENTAL CALCIUM) 500 MG chewable tablet Chew 1 tablet by mouth daily as needed for indigestion or heartburn.     ELIQUIS 5 MG TABS tablet Take 5 mg by mouth 2 (two) times daily.     famotidine (PEPCID AC MAXIMUM STRENGTH) 20 MG tablet Take 20 mg by mouth daily at 12 noon.      ibuprofen (ADVIL) 200 MG tablet Take 200 mg by mouth every 8 (eight) hours as needed for moderate pain. (Patient not taking: Reported on 02/09/2023)     loperamide (IMODIUM A-D) 2 MG tablet Take 0.5 tablets (1 mg total) by mouth daily. (Patient taking differently: Take 1 mg by mouth 3 (three) times daily as needed for diarrhea or loose stools.) 30 tablet 0   loratadine (CLARITIN) 5 MG chewable tablet Chew 5 mg by mouth daily.     metoprolol tartrate (LOPRESSOR) 25 MG tablet TAKE 1 TABLET TWICE A DAY 180 tablet 3   nitroGLYCERIN (NITROSTAT) 0.4 MG SL tablet Place 1 tablet (0.4 mg total) under the tongue every 5 (five) minutes x 3 doses as needed for chest pain (If no relief  after 3rd dose, call 911 or go to ED.). 90 tablet 3   Polyvinyl Alcohol-Povidone (REFRESH OP) Place 1 drop into both eyes daily as needed (dry eyes).     No current facility-administered medications for this visit.   Allergies:  Dicyclomine, Belladonna alkaloids, Ciprocin-fluocin-procin [fluocinolone], Myrbetriq [mirabegron], Prednisone, Quinolones, and Vesicare [solifenacin]   Social History: The patient  reports that she has been smoking cigarettes. She has a 40 pack-year smoking history. She has never used smokeless tobacco. She reports current alcohol use. She reports that she does not use drugs.   Family History: The patient's family history includes Arthritis  in her father and mother; Colon cancer (age of onset: 64) in her cousin; Colon cancer (age of onset: 35) in her paternal uncle; Dementia in her mother; Diverticulosis in her sister; Glaucoma in her sister; Hypertension in her brother, father, mother, and sister; Prostate cancer in her father; Stroke in her mother.   ROS:  Please see the history of present illness. Otherwise, complete review of systems is positive for none.  All other systems are reviewed and negative.   Physical Exam: VS:  There were no vitals taken for this visit., BMI There is no height or weight on file to calculate BMI.  Wt Readings from Last 3 Encounters:  02/09/23 156 lb (70.8 kg)  11/05/22 159 lb (72.1 kg)  03/22/20 162 lb (73.5 kg)    General: Patient appears comfortable at rest. HEENT: Conjunctiva and lids normal, oropharynx clear with moist mucosa. Neck: Supple, no elevated JVP or carotid bruits, no thyromegaly. Lungs: Clear to auscultation, nonlabored breathing at rest. Cardiac: Regular rate and rhythm, no S3 or significant systolic murmur, no pericardial rub. Abdomen: Soft, nontender, no hepatomegaly, bowel sounds present, no guarding or rebound. Extremities: No pitting edema, distal pulses 2+. Skin: Warm and dry. Musculoskeletal: No kyphosis. Neuropsychiatric: Alert and oriented x3, affect grossly appropriate.  Recent Labwork: No results found for requested labs within last 365 days.  No results found for: "CHOL", "TRIG", "HDL", "CHOLHDL", "VLDL", "LDLCALC", "LDLDIRECT"  Other Studies Reviewed Today:  Assessment and Plan:  Paroxysmal A-fib: Event monitor from 11/23 performed at her PCPs office showed less than 1% atrial fibrillation, longest episode lasting 6 minutes and 3 seconds.  She continues to have intermittent palpitations lasting for few seconds.  EKG today showed NSR.  Continue metoprolol tartrate 25 mg twice daily and Eliquis 5 mg twice daily.  No bleeding complications and no risk of falls.  She  does not have any risk factors for OSA.  Severe tricuspid regurgitation: Severe TR was noted on 11/23 echocardiogram done at her PCPs office.  Repeat echocardiogram in August 2024 showed mild TR.  No need of any surveillance echocardiogram in the future.  Chest pain, resolved: Exercise Myoview in 2023 showed no evidence of ischemia.  Nicotine abuse: Counseling.     Medication Adjustments/Labs and Tests Ordered: Current medicines are reviewed at length with the patient today.  Concerns regarding medicines are outlined above.   Tests Ordered: No orders of the defined types were placed in this encounter.   Medication Changes: No orders of the defined types were placed in this encounter.   Disposition:  Follow up 1 year  Signed Jersi Mcmaster Verne Spurr, MD, 02/02/2024 10:34 AM    [facility] at [facility] [address] [place], [place], [place] [address]

## 2024-02-02 NOTE — Patient Instructions (Signed)

## 2024-02-04 DIAGNOSIS — I4891 Unspecified atrial fibrillation: Secondary | ICD-10-CM | POA: Diagnosis not present

## 2024-02-04 DIAGNOSIS — R5383 Other fatigue: Secondary | ICD-10-CM | POA: Diagnosis not present

## 2024-02-04 DIAGNOSIS — J449 Chronic obstructive pulmonary disease, unspecified: Secondary | ICD-10-CM | POA: Diagnosis not present

## 2024-02-04 DIAGNOSIS — Z299 Encounter for prophylactic measures, unspecified: Secondary | ICD-10-CM | POA: Diagnosis not present

## 2024-02-04 DIAGNOSIS — J029 Acute pharyngitis, unspecified: Secondary | ICD-10-CM | POA: Diagnosis not present

## 2024-02-08 DIAGNOSIS — H40053 Ocular hypertension, bilateral: Secondary | ICD-10-CM | POA: Diagnosis not present

## 2024-02-12 ENCOUNTER — Telehealth: Payer: Self-pay | Admitting: Internal Medicine

## 2024-02-12 NOTE — Telephone Encounter (Signed)
 Spoke with Lelon Mast with [facility] - she stated that Dr. Desiree Lucy wanted to make our doctor aware that when patient c/o having the distorted vision that she had speech problems during the episode as well.  She is suggesting that she have Neuro work up.  Informed Lelon Mast that message will be sent to provider to get further advice.

## 2024-02-12 NOTE — Telephone Encounter (Signed)
 [facility] is requesting call back to discuss tests they believe patient needs to have in regards to symptoms she is having with migraines and speech issues. Requesting call back to discuss further.

## 2024-02-15 NOTE — Telephone Encounter (Signed)
 Left detailed voice message for Suzanne Combs informed of provider message.

## 2024-02-25 DIAGNOSIS — Z1231 Encounter for screening mammogram for malignant neoplasm of breast: Secondary | ICD-10-CM | POA: Diagnosis not present

## 2024-05-26 ENCOUNTER — Other Ambulatory Visit: Payer: Self-pay | Admitting: Internal Medicine

## 2024-06-16 DIAGNOSIS — E7800 Pure hypercholesterolemia, unspecified: Secondary | ICD-10-CM | POA: Diagnosis not present

## 2024-06-16 DIAGNOSIS — Z299 Encounter for prophylactic measures, unspecified: Secondary | ICD-10-CM | POA: Diagnosis not present

## 2024-06-16 DIAGNOSIS — Z1339 Encounter for screening examination for other mental health and behavioral disorders: Secondary | ICD-10-CM | POA: Diagnosis not present

## 2024-06-16 DIAGNOSIS — Z1331 Encounter for screening for depression: Secondary | ICD-10-CM | POA: Diagnosis not present

## 2024-06-16 DIAGNOSIS — E559 Vitamin D deficiency, unspecified: Secondary | ICD-10-CM | POA: Diagnosis not present

## 2024-06-16 DIAGNOSIS — I4891 Unspecified atrial fibrillation: Secondary | ICD-10-CM | POA: Diagnosis not present

## 2024-06-16 DIAGNOSIS — Z0000 Encounter for general adult medical examination without abnormal findings: Secondary | ICD-10-CM | POA: Diagnosis not present

## 2024-06-16 DIAGNOSIS — F17210 Nicotine dependence, cigarettes, uncomplicated: Secondary | ICD-10-CM | POA: Diagnosis not present

## 2024-06-16 DIAGNOSIS — R5383 Other fatigue: Secondary | ICD-10-CM | POA: Diagnosis not present

## 2024-06-16 DIAGNOSIS — Z79899 Other long term (current) drug therapy: Secondary | ICD-10-CM | POA: Diagnosis not present

## 2024-06-16 DIAGNOSIS — Z7189 Other specified counseling: Secondary | ICD-10-CM | POA: Diagnosis not present

## 2024-06-16 DIAGNOSIS — R52 Pain, unspecified: Secondary | ICD-10-CM | POA: Diagnosis not present

## 2024-06-28 DIAGNOSIS — J069 Acute upper respiratory infection, unspecified: Secondary | ICD-10-CM | POA: Diagnosis not present

## 2024-06-28 DIAGNOSIS — R0981 Nasal congestion: Secondary | ICD-10-CM | POA: Diagnosis not present

## 2024-06-28 DIAGNOSIS — Z299 Encounter for prophylactic measures, unspecified: Secondary | ICD-10-CM | POA: Diagnosis not present

## 2024-06-28 DIAGNOSIS — E7800 Pure hypercholesterolemia, unspecified: Secondary | ICD-10-CM | POA: Diagnosis not present

## 2024-06-29 ENCOUNTER — Encounter (INDEPENDENT_AMBULATORY_CARE_PROVIDER_SITE_OTHER): Payer: Self-pay | Admitting: *Deleted

## 2024-07-13 ENCOUNTER — Encounter (INDEPENDENT_AMBULATORY_CARE_PROVIDER_SITE_OTHER): Payer: Self-pay | Admitting: Gastroenterology

## 2024-07-13 ENCOUNTER — Ambulatory Visit (INDEPENDENT_AMBULATORY_CARE_PROVIDER_SITE_OTHER): Admitting: Gastroenterology

## 2024-07-13 ENCOUNTER — Telehealth: Payer: Self-pay | Admitting: *Deleted

## 2024-07-13 VITALS — BP 129/73 | HR 55 | Temp 97.8°F | Ht 64.5 in | Wt 149.1 lb

## 2024-07-13 DIAGNOSIS — K529 Noninfective gastroenteritis and colitis, unspecified: Secondary | ICD-10-CM

## 2024-07-13 DIAGNOSIS — K638219 Small intestinal bacterial overgrowth, unspecified: Secondary | ICD-10-CM | POA: Diagnosis not present

## 2024-07-13 DIAGNOSIS — K580 Irritable bowel syndrome with diarrhea: Secondary | ICD-10-CM | POA: Insufficient documentation

## 2024-07-13 DIAGNOSIS — K2970 Gastritis, unspecified, without bleeding: Secondary | ICD-10-CM | POA: Insufficient documentation

## 2024-07-13 MED ORDER — PSYLLIUM 58.6 % PO PACK: 1.0000 | PACK | Freq: Two times a day (BID) | ORAL | 2 refills | Status: AC

## 2024-07-13 MED ORDER — RIFAXIMIN 550 MG PO TABS: 550.0000 mg | ORAL_TABLET | Freq: Three times a day (TID) | ORAL | 0 refills | Status: DC

## 2024-07-13 NOTE — Telephone Encounter (Signed)
  Request for patient to stop medication prior to procedure or is needing cleareance  07/13/24  Suzanne Combs 02-04-1946  What type of surgery is being performed? Esophagogastroduodenoscopy  When is surgery scheduled? TBD  What type of clearance is required (medical or pharmacy to hold medication or both? medication  Are there any medications that need to be held prior to surgery and how long? Eliquis x 2 days  Name of physician performing surgery?  Dr.Ahmed Raynaldo [facility] at [facility] Phone: [phone] Fax: [phone]  Anethesia type (none, local, MAC, general)? MAC

## 2024-07-13 NOTE — Telephone Encounter (Signed)
 Patient will need a CBC for pharmacy labs. Had CMET drawn today per surgeon's office.

## 2024-07-13 NOTE — Telephone Encounter (Signed)
 Pharmacy please advise on holding Eliquis prior to  Esophagogastroduodenoscopy scheduled for TBD. Has had labs 7/25. Thank you.

## 2024-07-13 NOTE — Patient Instructions (Signed)
 It was very nice to meet you today, as dicussed with will plan for the following :  1) labwork and stool samples  2) Upper endoscopy  3) CREON  4) Rifaximin  for 14 days   5) Xray abdomen

## 2024-07-13 NOTE — Progress Notes (Signed)
 Suzanne Combs , M.D. Gastroenterology & Hepatology [facility] [address] [place], [place] [address] Primary Care Physician: [name], MD [address] [place] [address]  Chief Complaint: Chronic diarrhea .history of biopsy demonstrating granulomatous gastritis   History of Present Illness:  Suzanne Combs is a 78 y.o. female with arthritis, GERD who presents for evaluation of chronic diarrhea and history of biopsy demonstrating possible granulomatous gastritis   Patient was last seen by Dr. Golda  on in 2021 for diarrhea where she was given prescription of rifaximin  which was denied by insurance.  At that time she took amoxicillin which did not improve her diarrhea which suggests that she might have SIBO in addition to IBS.  She was given metronidazole  for 2 weeks  Patient is presenting back again with chronic diarrhea 4-5 times daily.  With nocturnal symptoms as well as this will recur in the morning.  Patient has not noticed any relationship with food intake.  Mostly liquid stools in the morning and affecting quality of her life at this embarrassing  to the patient The patient denies having any nausea, vomiting, fever, chills, hematochezia, melena, hematemesis, abdominal distention, abdominal pain,  jaundice, pruritus or weight loss.  Last labs from 06/2024 hemoglobin 12.9 platelet 469 liver enzymes normal vitamin D 50 TSH 0.8 Last EGD:2021 - Normal esophagus. - Z- line irregular, 38 cm from the incisors. - 2 cm hiatal hernia. - Gastritis. - Petechiah hemorrhage in the gastric antrum. Biopsied. - Normal duodenal bulb and second portion of the duodenum. Biopsied.  Last Colonoscopy:2021  - Perianal skin tags found on perianal exam. - Diverticulosis in the sigmoid colon. - External hemorrhoids. - Biopsies were taken with a cold forceps from the ascending colon and sigmoid colon for evaluation of microscopic colitis.   Gastric  antral mucosa with mild chronic gastritis, microgranulomata and reactive changes. - See comment.  B. DUODENUM, BIOPSY: - Duodenal mucosa with no significant pathologic findings. - Negative for increased intraepithelial lymphocytes and villous architectural changes.  C. COLON, ASCENDING AND SIGMOID, BIOPSY: - Colonic mucosa with no significant pathologic findings. - Negative for active inflammation and other abnormalities.  COMMENT:  A. The microgranulomata are non-necrotizing.  The differential diagnosis includes idiopathic, foreign body, infection, sarcoidosis and Crohn's disease, among others.  Polarized light examination is negative. Special stains AFB (acid fast bacillus) and GMS (fungus) are noncontributory as the foci of interest cut away on further processing. Special Warthin-Starry stain is negative for organisms of Helicobacter pylori.   Dr Golda note: Gastric biopsy shows nonnecrotizing granulomata.  Endoscopically appearances not unusual.  Significance of this finding is not clear.  Will check angiotensin-converting enzyme.  Doubt that has anything to do with her diarrhea.   Duodenal biopsy negative for celiac disease and colonic biopsy negative for micro scopic colitis.  Will treat patient with Xifaxan  550 mg 3 times a day for 2 weeks  Patient advised to keep stool diary and call office with progress report in day 13.  If she has partial improvement will consider treating her for another 2 weeks.   FHx: neg for any gastrointestinal/liver disease, no malignancies Social: neg smoking, alcohol  or illicit drug use Surgical: Cholecystectomy  Past Medical History: Past Medical History:  Diagnosis Date   Arthritis    back, neck, and fingers   Chronic kidney disease    Left ureter obstruction   Complication of anesthesia    GERD (gastroesophageal reflux disease)    History  of hiatal hernia    Neuromuscular disorder (HCC)    bulging disc in neck   PONV  (postoperative nausea and vomiting)     Past Surgical History: Past Surgical History:  Procedure Laterality Date   BIOPSY  03/22/2020   Procedure: BIOPSY;  Surgeon: Golda Claudis PENNER, MD;  [facility];  Service: Endoscopy;;   CHOLECYSTECTOMY     COLONOSCOPY N/A 06/28/2015   Procedure: COLONOSCOPY;  Surgeon: Claudis PENNER Golda, MD;  [facility];  Service: Endoscopy;  Laterality: N/A;  1100   COLONOSCOPY N/A 03/22/2020   Procedure: COLONOSCOPY;  Surgeon: Golda Claudis PENNER, MD;  [facility];  Service: Endoscopy;  Laterality: N/A;   ESOPHAGOGASTRODUODENOSCOPY N/A 03/22/2020   Procedure: ESOPHAGOGASTRODUODENOSCOPY (EGD);  Surgeon: Golda Claudis PENNER, MD;  [facility];  Service: Endoscopy;  Laterality: N/A;  830   EYE SURGERY Bilateral 2002   cataract   TONSILLECTOMY      Family History: Family History  Problem Relation Age of Onset   Stroke Mother    Hypertension Mother    Dementia Mother    Arthritis Mother    Hypertension Father    Prostate cancer Father    Arthritis Father    Hypertension Sister    Glaucoma Sister    Diverticulosis Sister    Hypertension Brother    Colon cancer Cousin 37   Colon cancer Paternal Uncle 53    Social History: Social History   Tobacco Use  Smoking Status Every Day   Current packs/day: 1.00   Average packs/day: 1 pack/day for 40.0 years (40.0 ttl pk-yrs)   Types: Cigarettes  Smokeless Tobacco Never   Social History   Substance and Sexual Activity  Alcohol  Use Yes   Comment: occasionally   Social History   Substance and Sexual Activity  Drug Use No    Allergies: Allergies  Allergen Reactions   Dicyclomine  Other (See Comments)    Severe dry mouth   Belladonna Alkaloids     Per Patient is caused bad headaches.   Ciprocin-Fluocin-Procin [Fluocinolone]     Unknown reaction   Myrbetriq [Mirabegron]     headaches   Prednisone Other (See Comments)    Can not tablets(Pain,headache)   Quinolones  Other (See Comments)    Affects joints   Vesicare [Solifenacin]     Increased BP and heart rate    Medications: Current Outpatient Medications  Medication Sig Dispense Refill   Ascorbic Acid (VITAMIN C) 1000 MG tablet Take 2,000 mg by mouth daily at 12 noon.     calcium carbonate (CALCIUM 600) 600 MG TABS tablet Take 600 mg by mouth daily with breakfast.     calcium carbonate (TUMS - DOSED IN MG ELEMENTAL CALCIUM) 500 MG chewable tablet Chew 1 tablet by mouth daily as needed for indigestion or heartburn.     Cholecalciferol (DIALYVITE VITAMIN D3 MAX) 1.25 MG (50000 UT) TABS Take by mouth once a week.     ELIQUIS 5 MG TABS tablet Take 5 mg by mouth 2 (two) times daily.     famotidine (PEPCID AC MAXIMUM STRENGTH) 20 MG tablet Take 20 mg by mouth daily at 12 noon.      ibuprofen (ADVIL) 200 MG tablet Take 200 mg by mouth every 8 (eight) hours as needed for moderate pain (pain score 4-6).     loperamide  (IMODIUM  A-D) 2 MG tablet Take 2 mg by mouth as needed for diarrhea or loose stools.     loratadine (  CLARITIN) 5 MG chewable tablet Chew 5 mg by mouth daily.     metoprolol  tartrate (LOPRESSOR ) 25 MG tablet TAKE 1 TABLET TWICE A DAY 180 tablet 2   nitroGLYCERIN  (NITROSTAT ) 0.4 MG SL tablet Place 1 tablet (0.4 mg total) under the tongue every 5 (five) minutes x 3 doses as needed for chest pain (If no relief after 3rd dose, call 911 or go to ED.). 90 tablet 3   Polyvinyl Alcohol -Povidone (REFRESH OP) Place 1 drop into both eyes daily as needed (dry eyes).     No current facility-administered medications for this visit.    Review of Systems: GENERAL: negative for malaise, night sweats HEENT: No changes in hearing or vision, no nose bleeds or other nasal problems. NECK: Negative for lumps, goiter, pain and significant neck swelling RESPIRATORY: Negative for cough, wheezing CARDIOVASCULAR: Negative for chest pain, leg swelling, palpitations, orthopnea GI: SEE HPI MUSCULOSKELETAL: Negative for  joint pain or swelling, back pain, and muscle pain. SKIN: Negative for lesions, rash HEMATOLOGY Negative for prolonged bleeding, bruising easily, and swollen nodes. ENDOCRINE: Negative for cold or heat intolerance, polyuria, polydipsia and goiter. NEURO: negative for tremor, gait imbalance, syncope and seizures. The remainder of the review of systems is noncontributory.   Physical Exam: BP 129/73   Pulse (!) 55   Temp 97.8 F (36.6 C)   Ht 5' 4.5 (1.638 m)   Wt 149 lb 1.6 oz (67.6 kg)   BMI 25.20 kg/m  GENERAL: The patient is AO x3, in no acute distress. HEENT: Head is normocephalic and atraumatic. EOMI are intact. Mouth is well hydrated and without lesions. NECK: Supple. No masses LUNGS: Clear to auscultation. No presence of rhonchi/wheezing/rales. Adequate chest expansion HEART: RRR, normal s1 and s2. ABDOMEN: Soft, nontender, no guarding, no peritoneal signs, and nondistended. BS +. No masses.   Imaging/Labs: as above     Latest Ref Rng & Units 01/30/2020    2:36 PM 12/26/2019    2:56 PM  CBC  WBC 3.8 - 10.8 Thousand/uL 10.0  11.1   Hemoglobin 11.7 - 15.5 g/dL 86.1  86.2   Hematocrit 35.0 - 45.0 % 40.5  39.0   Platelets 140 - 400 Thousand/uL 491  474    No results found for: IRON, TIBC, FERRITIN  I personally reviewed and interpreted the available labs, imaging and endoscopic files.  Impression and Plan:  Suzanne Combs is a 78 y.o. female with arthritis, GERD who presents for evaluation of chronic diarrhea and history of biopsy demonstrating possible granulomatous gastritis   #Chronic diarrhea # biopsy demonstrating possible granulomatous gastritis #SIBO/IBS  Patient has more than 3 loose stools for more than 4 weeks hence qualifies for chronic diarrhea  This could be secondary to secretory , osmotic ,functional ,malabsorptive or inflammatory diarrhea  There is no correlation with food intake and hence patient is recommended keeping a food  diary  Patient diet is low in fiber and recommended Metamucil starting 1 scoop daily for 1 week followed by 2 scoops daily second week, and 3 scoops daily thereafter, to bulk up stool  We will obtain blood work including  CRP  fecal calprotectin, check for celiac disease with TTG IgA and total IgA, and obtain stool studies including fecal calprotectin   Patient could have overlap between IBS-D and SIBO as she has abdominal discomfort relieved with defecation.  Previously had significant improvement with antibiotics and patient reports she takes antibiotics for any other reasons her diarrhea improves  Will obtain abdominal x-ray  to assess stool burden and make sure this is not overflow diarrhea   ACE level is normal .  Previous colonoscopy without evidence of Crohn's disease and no sarcoidosis as well as counts are normal  There was concern of granulomatous gastritis possibly food related, elevated or effect of medication in 2021  Suggested repeat biopsy was 3 to 4 months with patient is overdue for.  Will plan for upper endoscopy with biopsies ( mapping as well)  Will prescribe Xifaxan  and use loperamide  2 to 4 mg daily  Will give trial of Creon  If all above we will do outpatient will give trial of cholestyramine at this could be bile acid diarrhea as patient had history of cholecystectomy  All questions were answered.      Duke Weisensel Faizan Minoru Chap, MD Gastroenterology and Hepatology [facility]   This chart has been completed using [facility] Dictation software, and while attempts have been made to ensure accuracy , certain words and phrases may not be transcribed as intended

## 2024-07-14 ENCOUNTER — Telehealth (INDEPENDENT_AMBULATORY_CARE_PROVIDER_SITE_OTHER): Payer: Self-pay | Admitting: Gastroenterology

## 2024-07-14 NOTE — Telephone Encounter (Signed)
 PA completed for Xifaxan  550 mg. Awaiting determination

## 2024-07-14 NOTE — Telephone Encounter (Signed)
 I will forward back to preop and pharm-d for blood thinner recommendations. Request is only for pharmacy clearance.

## 2024-07-15 ENCOUNTER — Other Ambulatory Visit (INDEPENDENT_AMBULATORY_CARE_PROVIDER_SITE_OTHER): Payer: Self-pay | Admitting: Gastroenterology

## 2024-07-15 DIAGNOSIS — K638219 Small intestinal bacterial overgrowth, unspecified: Secondary | ICD-10-CM

## 2024-07-15 DIAGNOSIS — K580 Irritable bowel syndrome with diarrhea: Secondary | ICD-10-CM

## 2024-07-15 LAB — TISSUE TRANSGLUTAMINASE ABS,IGG,IGA
(tTG) Ab, IgA: <1 U/mL
(tTG) Ab, IgG: <1 U/mL

## 2024-07-15 LAB — COMPREHENSIVE METABOLIC PANEL WITH GFR
AG Ratio: 1.6 (calc) (ref 1.0–2.5)
ALT: 10 U/L (ref 6–29)
AST: 13 U/L (ref 10–35)
Albumin: 3.7 g/dL (ref 3.6–5.1)
Alkaline phosphatase (APISO): 62 U/L (ref 37–153)
BUN: 7 mg/dL (ref 7–25)
CO2: 29 mmol/L (ref 20–32)
Calcium: 9.1 mg/dL (ref 8.6–10.4)
Chloride: 97 mmol/L — ABNORMAL LOW (ref 98–110)
Creat: 0.63 mg/dL (ref 0.60–1.00)
Globulin: 2.3 g/dL (ref 1.9–3.7)
Glucose, Bld: 91 mg/dL (ref 65–99)
Potassium: 4.2 mmol/L (ref 3.5–5.3)
Sodium: 132 mmol/L — ABNORMAL LOW (ref 135–146)
Total Bilirubin: 0.5 mg/dL (ref 0.2–1.2)
Total Protein: 6 g/dL — ABNORMAL LOW (ref 6.1–8.1)
eGFR: 91 mL/min/1.73m2 (ref >=60)

## 2024-07-15 LAB — C-REACTIVE PROTEIN: CRP: <3 mg/L (ref <8.0)

## 2024-07-15 LAB — IGA: Immunoglobulin A: 224 mg/dL (ref 70–320)

## 2024-07-15 NOTE — Telephone Encounter (Signed)
 Denial for Xifaxan  received. Contacted pt and advised her Xifaxan  was denied. Explained that we can give her a printed script of Xifaxan  and information to a [place] pharmacy where she can get #50 for $90. Pt is OK with that. I advised pt that provider is in procedure today and Monday but I can have the script and information up front on Tuesday. Pt verbalized understanding.  Pt also states that she is not going to take any more of the Metamucil because her feet and ankles were swelling. Pt states nothing else has changed, no medication changed. Pt states she is going to keep a check on her ankles and feet.

## 2024-07-18 ENCOUNTER — Ambulatory Visit (INDEPENDENT_AMBULATORY_CARE_PROVIDER_SITE_OTHER): Payer: Self-pay | Admitting: Gastroenterology

## 2024-07-18 NOTE — Telephone Encounter (Signed)
     Primary Cardiologist: Vishnu P Mallipeddi, MD  Clinical pharmacist have reviewed patient's chart and provided the following recommendations for, Suzanne Combs :  Patient has not had an Afib/aflutter ablation within the last 3 months or DCCV within the last 30 days   Per office protocol, patient can hold Eliquis for 2 days prior to procedure.  I will route this recommendation to the requesting party via Epic fax function and remove from pre-op pool.  Please call with questions.  Josefa HERO. Hyder Deman NP-C     07/18/2024, 2:05 PM [facility] [address] Office [phone] Fax [phone]

## 2024-07-18 NOTE — Telephone Encounter (Signed)
 Patient with diagnosis of afib on Eliquis for anticoagulation.    Procedure: Esophagogastroduodenoscopy  Date of procedure: TBD   CHA2DS2-VASc Score = 3   This indicates a 3.2% annual risk of stroke. The patient's score is based upon: CHF History: 0 HTN History: 0 Diabetes History: 0 Stroke History: 0 Vascular Disease History: 0 Age Score: 2 Gender Score: 1      CrCl 78 ml/min  Patient has not had an Afib/aflutter ablation within the last 3 months or DCCV within the last 30 days  Per office protocol, patient can hold Eliquis for 2 days prior to procedure.    **This guidance is not considered finalized until pre-operative APP has relayed final recommendations.**

## 2024-07-19 ENCOUNTER — Ambulatory Visit (HOSPITAL_COMMUNITY)
Admission: RE | Admit: 2024-07-19 | Discharge: 2024-07-19 | Disposition: A | Discharge status: Home / Self Care | Source: Ambulatory Visit | Attending: Gastroenterology | Admitting: Gastroenterology

## 2024-07-19 DIAGNOSIS — R197 Diarrhea, unspecified: Secondary | ICD-10-CM | POA: Diagnosis not present

## 2024-07-19 DIAGNOSIS — M129 Arthropathy, unspecified: Secondary | ICD-10-CM | POA: Diagnosis not present

## 2024-07-19 DIAGNOSIS — K580 Irritable bowel syndrome with diarrhea: Secondary | ICD-10-CM | POA: Diagnosis not present

## 2024-07-19 DIAGNOSIS — Q438 Other specified congenital malformations of intestine: Secondary | ICD-10-CM | POA: Diagnosis not present

## 2024-07-20 ENCOUNTER — Other Ambulatory Visit (INDEPENDENT_AMBULATORY_CARE_PROVIDER_SITE_OTHER): Payer: Self-pay

## 2024-07-20 DIAGNOSIS — K580 Irritable bowel syndrome with diarrhea: Secondary | ICD-10-CM

## 2024-07-20 DIAGNOSIS — K638219 Small intestinal bacterial overgrowth, unspecified: Secondary | ICD-10-CM

## 2024-07-20 MED ORDER — RIFAXIMIN 550 MG PO TABS: 550.0000 mg | ORAL_TABLET | Freq: Three times a day (TID) | ORAL | 0 refills | Status: DC

## 2024-07-21 LAB — CALPROTECTIN, FECAL: Calprotectin, Fecal: 422 ug/g — ABNORMAL HIGH (ref 0–120)

## 2024-07-26 ENCOUNTER — Encounter: Payer: Self-pay | Admitting: *Deleted

## 2024-07-26 ENCOUNTER — Other Ambulatory Visit: Payer: Self-pay | Admitting: *Deleted

## 2024-07-26 MED ORDER — PEG 3350-KCL-NA BICARB-NACL 420 G PO SOLR: 4000.0000 mL | Freq: Once | ORAL | 0 refills | Status: AC

## 2024-07-26 NOTE — Telephone Encounter (Signed)
 Pt has been scheduled for 07/29/24. Instructions printed and placed up front for pt to pick up. Pt informed to hold Eliquis x 2 days prior to procedure.

## 2024-07-26 NOTE — Progress Notes (Signed)
 Hi Tanya ,  Can you please call the patient and tell the patient the stool sample is positive for fecal calprotectin which is an indicator for inflammation in the colon.  Given she is having diarrhea and now positive stool test for inflammation I do recommend also colonoscopy along with upper endoscopy to make sure she does not have inflammatory bowel disease  Thanks,  Sopheap Basic Faizan Nataly Pacifico, MD Gastroenterology and Hepatology [facility] Gastroenterology =============  Normal liver enzymes Celiac panel negative CRP negative  Fecal caprotectin elevated at 422 (POSITIVE)

## 2024-07-27 ENCOUNTER — Other Ambulatory Visit (HOSPITAL_COMMUNITY)

## 2024-07-29 ENCOUNTER — Encounter (HOSPITAL_COMMUNITY): Admission: RE | Payer: Self-pay | Source: Home / Self Care

## 2024-07-29 ENCOUNTER — Ambulatory Visit (HOSPITAL_COMMUNITY): Admission: RE | Admit: 2024-07-29 | Source: Home / Self Care | Admitting: Gastroenterology

## 2024-07-29 SURGERY — EGD (ESOPHAGOGASTRODUODENOSCOPY)
Anesthesia: Choice

## 2024-08-02 NOTE — Telephone Encounter (Signed)
 Pt has been rescheduled for 09/06/24. Updated instructions mailed

## 2024-08-11 NOTE — Telephone Encounter (Signed)
 Pt will be getting medications in September due to issues with money order

## 2024-08-11 NOTE — Telephone Encounter (Signed)
 Patient called today stating she was to let you know if she had trouble with her Xifaxan  script through [facility]. She says she has not yet received the medication as of yet as she had trouble with her money order. She has since fixed this and says she received a letter from the pharmacy stating her medication should reach her either on 08/22/2024 or 08/26/2024. She says she just wanted to let us  know and is she to start the medication as soon as she receives it and take it up to the 09/06/2024 Tcs date.

## 2024-09-06 ENCOUNTER — Ambulatory Visit (HOSPITAL_BASED_OUTPATIENT_CLINIC_OR_DEPARTMENT_OTHER): Admitting: Anesthesiology

## 2024-09-06 ENCOUNTER — Ambulatory Visit (HOSPITAL_COMMUNITY): Admitting: Anesthesiology

## 2024-09-06 ENCOUNTER — Encounter (HOSPITAL_COMMUNITY): Payer: Self-pay | Admitting: Gastroenterology

## 2024-09-06 ENCOUNTER — Ambulatory Visit (HOSPITAL_COMMUNITY)
Admission: RE | Admit: 2024-09-06 | Discharge: 2024-09-06 | Disposition: A | Discharge status: Home / Self Care | Attending: Gastroenterology | Admitting: Gastroenterology

## 2024-09-06 ENCOUNTER — Other Ambulatory Visit: Payer: Self-pay

## 2024-09-06 ENCOUNTER — Encounter (HOSPITAL_COMMUNITY)
Admission: RE | Disposition: A | Discharge status: Home / Self Care | Payer: Self-pay | Source: Home / Self Care | Attending: Gastroenterology

## 2024-09-06 DIAGNOSIS — K529 Noninfective gastroenteritis and colitis, unspecified: Secondary | ICD-10-CM | POA: Insufficient documentation

## 2024-09-06 DIAGNOSIS — K649 Unspecified hemorrhoids: Secondary | ICD-10-CM

## 2024-09-06 DIAGNOSIS — K219 Gastro-esophageal reflux disease without esophagitis: Secondary | ICD-10-CM | POA: Diagnosis not present

## 2024-09-06 DIAGNOSIS — I129 Hypertensive chronic kidney disease with stage 1 through stage 4 chronic kidney disease, or unspecified chronic kidney disease: Secondary | ICD-10-CM | POA: Diagnosis not present

## 2024-09-06 DIAGNOSIS — K5730 Diverticulosis of large intestine without perforation or abscess without bleeding: Secondary | ICD-10-CM

## 2024-09-06 DIAGNOSIS — K449 Diaphragmatic hernia without obstruction or gangrene: Secondary | ICD-10-CM | POA: Insufficient documentation

## 2024-09-06 DIAGNOSIS — Z13810 Encounter for screening for upper gastrointestinal disorder: Secondary | ICD-10-CM

## 2024-09-06 DIAGNOSIS — K2950 Unspecified chronic gastritis without bleeding: Secondary | ICD-10-CM | POA: Diagnosis not present

## 2024-09-06 DIAGNOSIS — K648 Other hemorrhoids: Secondary | ICD-10-CM | POA: Diagnosis not present

## 2024-09-06 DIAGNOSIS — M1990 Unspecified osteoarthritis, unspecified site: Secondary | ICD-10-CM | POA: Insufficient documentation

## 2024-09-06 DIAGNOSIS — Z8261 Family history of arthritis: Secondary | ICD-10-CM | POA: Diagnosis not present

## 2024-09-06 DIAGNOSIS — Z8249 Family history of ischemic heart disease and other diseases of the circulatory system: Secondary | ICD-10-CM | POA: Insufficient documentation

## 2024-09-06 DIAGNOSIS — K909 Intestinal malabsorption, unspecified: Secondary | ICD-10-CM

## 2024-09-06 DIAGNOSIS — N189 Chronic kidney disease, unspecified: Secondary | ICD-10-CM | POA: Diagnosis not present

## 2024-09-06 DIAGNOSIS — F17210 Nicotine dependence, cigarettes, uncomplicated: Secondary | ICD-10-CM | POA: Diagnosis not present

## 2024-09-06 HISTORY — PX: ESOPHAGOGASTRODUODENOSCOPY: SHX5428

## 2024-09-06 HISTORY — PX: COLONOSCOPY: SHX5424

## 2024-09-06 LAB — HM COLONOSCOPY

## 2024-09-06 SURGERY — COLONOSCOPY
Anesthesia: General

## 2024-09-06 MED ORDER — PROPOFOL 10 MG/ML IV BOLUS
INTRAVENOUS | Status: DC | PRN
  Administered 2024-09-06: 40 mg via INTRAVENOUS
  Administered 2024-09-06: 125 ug/kg/min via INTRAVENOUS
  Administered 2024-09-06: 80 mg via INTRAVENOUS

## 2024-09-06 MED ORDER — EPHEDRINE SULFATE-NACL 50-0.9 MG/10ML-% IV SOSY
PREFILLED_SYRINGE | INTRAVENOUS | Status: DC | PRN
  Administered 2024-09-06: 10 mg via INTRAVENOUS

## 2024-09-06 MED ORDER — LIDOCAINE 2% (20 MG/ML) 5 ML SYRINGE
INTRAMUSCULAR | Status: DC | PRN
  Administered 2024-09-06: 60 mg via INTRAVENOUS

## 2024-09-06 MED ORDER — LACTATED RINGERS IV SOLN: INTRAVENOUS | Status: DC

## 2024-09-06 NOTE — Anesthesia Preprocedure Evaluation (Signed)
 Anesthesia Evaluation  Patient identified by MRN, date of birth, ID band Patient awake    Reviewed: Allergy & Precautions, H&P , NPO status , Patient's Chart, lab work & pertinent test results, reviewed documented beta blocker date and time   History of Anesthesia Complications (+) PONV and history of anesthetic complications  Airway Mallampati: II  TM Distance: >3 FB Neck ROM: full    Dental no notable dental hx.    Pulmonary neg pulmonary ROS, Current Smoker and Patient abstained from smoking.   Pulmonary exam normal breath sounds clear to auscultation       Cardiovascular Exercise Tolerance: Good hypertension, negative cardio ROS  Rhythm:regular Rate:Normal     Neuro/Psych  PSYCHIATRIC DISORDERS       Neuromuscular disease    GI/Hepatic Neg liver ROS, hiatal hernia,GERD  ,,  Endo/Other  negative endocrine ROS    Renal/GU Renal disease  negative genitourinary   Musculoskeletal   Abdominal   Peds  Hematology negative hematology ROS (+)   Anesthesia Other Findings   Reproductive/Obstetrics negative OB ROS                              Anesthesia Physical Anesthesia Plan  ASA: 2  Anesthesia Plan: General   Post-op Pain Management:    Induction:   PONV Risk Score and Plan: Propofol  infusion  Airway Management Planned:   Additional Equipment:   Intra-op Plan:   Post-operative Plan:   Informed Consent: I have reviewed the patients History and Physical, chart, labs and discussed the procedure including the risks, benefits and alternatives for the proposed anesthesia with the patient or authorized representative who has indicated his/her understanding and acceptance.     Dental Advisory Given  Plan Discussed with: CRNA  Anesthesia Plan Comments:         Anesthesia Quick Evaluation

## 2024-09-06 NOTE — Discharge Instructions (Signed)

## 2024-09-06 NOTE — Anesthesia Procedure Notes (Signed)
 Date/Time: 09/06/2024 10:28 AM  Performed by: Para Jerelene CROME, CRNAOxygen Delivery Method: Simple face mask Comments: POM Face Mask.

## 2024-09-06 NOTE — Op Note (Signed)
 [facility] Patient Name: Suzanne Combs Procedure Date: 09/06/2024 10:20 AM MRN: [id number] Date of Birth: May 26, 1946 Attending MD: Deatrice Dine , MD, [id number] CSN: [id number] Age: 78 Admit Type: Outpatient Procedure:                Colonoscopy Indications:              Chronic diarrhea Providers:                Deatrice Dine, MD, Leandrew Edelman RN, RN, Bascom Blush Referring MD:              Medicines:                Monitored Anesthesia Care Complications:            No immediate complications. Estimated Blood Loss:     Estimated blood loss was minimal. Procedure:                Pre-Anesthesia Assessment:                           - Prior to the procedure, a History and Physical                            was performed, and patient medications and                            allergies were reviewed. The patient's tolerance of                            previous anesthesia was also reviewed. The risks                            and benefits of the procedure and the sedation                            options and risks were discussed with the patient.                            All questions were answered, and informed consent                            was obtained. Prior Anticoagulants: The patient has                            taken no anticoagulant or antiplatelet agents                            except for NSAID medication. ASA Grade Assessment:                            II - A patient with mild systemic disease. After                            reviewing the risks and benefits, the patient was  deemed in satisfactory condition to undergo the                            procedure.                           After obtaining informed consent, the colonoscope                            was passed under direct vision. Throughout the                            procedure, the patient's blood pressure, pulse, and                            oxygen  saturations were monitored continuously. The                            [id number]-HQ190L ([id number]) Peds Colon was introduced                            through the anus and advanced to the the terminal                            ileum. The colonoscopy was performed without                            difficulty. The patient tolerated the procedure                            well. The quality of the bowel preparation was                            evaluated using the BBPS [facility] Bowel Preparation                            Scale) with scores of: Right Colon = 3, Transverse                            Colon = 3 and Left Colon = 3 (entire mucosa seen                            well with no residual staining, small fragments of                            stool or opaque liquid). The total BBPS score                            equals 9. The terminal ileum, ileocecal valve,                            appendiceal orifice, and rectum were photographed. Scope In: 10:43:38 AM Scope Out: 11:01:37 AM Scope Withdrawal Time: 0 hours 13 minutes 15  seconds  Total Procedure Duration: 0 hours 17 minutes 59 seconds  Findings:      The perianal and digital rectal examinations were normal.      There is no endoscopic evidence of inflammation in the entire colon.       Biopsies for histology were taken with a cold forceps from the entire       colon for evaluation of microscopic colitis.      The terminal ileum appeared normal.      Scattered medium-mouthed diverticula were found in the left colon.      Non-bleeding internal hemorrhoids were found during retroflexion. The       hemorrhoids were medium-sized. Impression:               - The examined portion of the ileum was normal.                           - Diverticulosis in the left colon.                           - Non-bleeding internal hemorrhoids. Moderate Sedation:      Per Anesthesia Care Recommendation:           - Patient has a contact number available  for                            emergencies. The signs and symptoms of potential                            delayed complications were discussed with the                            patient. Return to normal activities tomorrow.                            Written discharge instructions were provided to the                            patient.                           - Resume previous diet.                           - Continue present medications.                           - Await pathology results. Procedure Code(s):        --- Professional ---                           [phone], Colonoscopy, flexible; with biopsy, single                            or multiple Diagnosis Code(s):        --- Professional ---                           K64.8, Other hemorrhoids  K52.9, Noninfective gastroenteritis and colitis,                            unspecified                           K57.30, Diverticulosis of large intestine without                            perforation or abscess without bleeding CPT copyright 2022 American Medical Association. All rights reserved. The codes documented in this report are preliminary and upon coder review may  be revised to meet current compliance requirements. Deatrice Dine, MD Deatrice Dine, MD 09/06/2024 11:10:58 AM This report has been signed electronically. Number of Addenda: 0

## 2024-09-06 NOTE — Transfer of Care (Addendum)
 Immediate Anesthesia Transfer of Care Note  Patient: Suzanne Combs  Procedure(s) Performed: COLONOSCOPY EGD (ESOPHAGOGASTRODUODENOSCOPY)  Patient Location: Endoscopy Unit  Anesthesia Type:General  Level of Consciousness: drowsy and patient cooperative  Airway & Oxygen Therapy: Patient Spontanous Breathing  Post-op Assessment: Report given to RN and Post -op Vital signs reviewed and stable  Post vital signs: Reviewed and stable  Last Vitals:  Vitals Value Taken Time  BP 120/49 09/06/24   1107  Temp 36.4 09/06/24   1107  Pulse 70 09/06/24   1107  Resp    SpO2 99% 09/06/24   1107    Last Pain:  Vitals:   09/06/24 1026  TempSrc:   PainSc: 0-No pain      Patients Stated Pain Goal: 2 (09/06/24 1001)  Complications: No notable events documented.

## 2024-09-06 NOTE — H&P (Signed)
 Primary Care Physician:  [name], MD Primary Gastroenterologist:  Dr. Cinderella  Pre-Procedure History & Physical: HPI:  Suzanne Combs is a 78 y.o. female with arthritis, GERD who presents for evaluation of chronic diarrhea and history of biopsy demonstrating possible granulomatous gastritis    Previously patient  was given prescription of rifaximin  which was denied by insurance.  At that time she took amoxicillin which did not improve her diarrhea which suggests that she might have SIBO in addition to IBS.  She was given metronidazole  for 2 weeks   Patient is presenting back again with chronic diarrhea 4-5 times daily.  With nocturnal symptoms as well as this will recur in the morning.  Patient has not noticed any relationship with food intake.  Mostly liquid stools in the morning and affecting quality of her life at this embarrassing  to the patient The patient denies having any nausea, vomiting, fever, chills, hematochezia, melena, hematemesis, abdominal distention, abdominal pain,  jaundice, pruritus or weight loss.    Suggested repeat biopsy was 3 to 4 months with patient is overdue for.  Will plan for upper endoscopy with biopsies ( mapping as well)   Last labs from 06/2024 hemoglobin 12.9 platelet 469 liver enzymes normal vitamin D 50 TSH 0.8 Last [id number] - Normal esophagus. - Z- line irregular, 38 cm from the incisors. - 2 cm hiatal hernia. - Gastritis. - Petechiah hemorrhage in the gastric antrum. Biopsied. - Normal duodenal bulb and second portion of the duodenum. Biopsied.   Normal liver enzymes Celiac panel negative CRP negative  Fecal caprotectin elevated at 422 (POSITIVE)  Last Colonoscopy:2021   - Perianal skin tags found on perianal exam. - Diverticulosis in the sigmoid colon. - External hemorrhoids. - Biopsies were taken with a cold forceps from the ascending colon and sigmoid colon for evaluation of microscopic colitis.    Gastric antral mucosa with mild chronic  gastritis, microgranulomata and reactive changes. - See comment.  B. DUODENUM, BIOPSY: - Duodenal mucosa with no significant pathologic findings. - Negative for increased intraepithelial lymphocytes and villous architectural changes.  C. COLON, ASCENDING AND SIGMOID, BIOPSY: - Colonic mucosa with no significant pathologic findings. - Negative for active inflammation and other abnormalities.  COMMENT:  A. The microgranulomata are non-necrotizing.  The differential diagnosis includes idiopathic, foreign body, infection, sarcoidosis and Crohn's disease, among others.  Polarized light examination is negative. Special stains AFB (acid fast bacillus) and GMS (fungus) are noncontributory as the foci of interest cut away on further processing. Special Warthin-Starry stain is negative for organisms of Helicobacter pylori.    Dr Golda note: Gastric biopsy shows nonnecrotizing granulomata.  Endoscopically appearances not unusual.  Significance of this finding is not clear.  Will check angiotensin-converting enzyme.  Doubt that has anything to do with her diarrhea.   Duodenal biopsy negative for celiac disease and colonic biopsy negative for micro scopic colitis.  Will treat patient with Xifaxan  550 mg 3 times a day for 2 weeks  Patient advised to keep stool diary and call office with progress report in day 13.  If she has partial improvement will consider treating her for another 2 weeks.    FHx: neg for any gastrointestinal/liver disease, no malignancies Social: neg smoking, alcohol  or illicit drug use Surgical: Cholecystectomy  Past Medical History:  Diagnosis Date   Arthritis    back, neck, and fingers   Chronic kidney disease    Left ureter obstruction   Complication of anesthesia    GERD (gastroesophageal reflux  disease)    History of hiatal hernia    Neuromuscular disorder (HCC)    bulging disc in neck   PONV (postoperative nausea and vomiting)     Past Surgical  History:  Procedure Laterality Date   BIOPSY  03/22/2020   Procedure: BIOPSY;  Surgeon: Golda Claudis PENNER, MD;  Location: [facility];  Service: Endoscopy;;   CHOLECYSTECTOMY     COLONOSCOPY N/A 06/28/2015   Procedure: COLONOSCOPY;  Surgeon: Claudis PENNER Golda, MD;  Location: [facility];  Service: Endoscopy;  Laterality: N/A;  1100   COLONOSCOPY N/A 03/22/2020   Procedure: COLONOSCOPY;  Surgeon: Golda Claudis PENNER, MD;  Location: [facility];  Service: Endoscopy;  Laterality: N/A;   ESOPHAGOGASTRODUODENOSCOPY N/A 03/22/2020   Procedure: ESOPHAGOGASTRODUODENOSCOPY (EGD);  Surgeon: Golda Claudis PENNER, MD;  Location: [facility];  Service: Endoscopy;  Laterality: N/A;  830   EYE SURGERY Bilateral 2002   cataract   TONSILLECTOMY      Prior to Admission medications   Medication Sig Start Date End Date Taking? Authorizing Provider  Ascorbic Acid (VITAMIN C) 1000 MG tablet Take 2,000 mg by mouth daily at 12 noon.    [provider]  calcium carbonate (CALCIUM 600) 600 MG TABS tablet Take 600 mg by mouth daily with breakfast.    [provider]  calcium carbonate (TUMS - DOSED IN MG ELEMENTAL CALCIUM) 500 MG chewable tablet Chew 1 tablet by mouth daily as needed for indigestion or heartburn.    [provider]  Cholecalciferol (DIALYVITE VITAMIN D3 MAX) 1.25 MG (50000 UT) TABS Take by mouth once a week. 09/25/14   [provider]  ELIQUIS 5 MG TABS tablet Take 5 mg by mouth 2 (two) times daily. 11/28/22   [provider]  famotidine (PEPCID AC MAXIMUM STRENGTH) 20 MG tablet Take 20 mg by mouth daily at 12 noon.     [provider]  ibuprofen (ADVIL) 200 MG tablet Take 200 mg by mouth every 8 (eight) hours as needed for moderate pain (pain score 4-6).    [provider]  loperamide  (IMODIUM  A-D) 2 MG tablet Take 2 mg by mouth as needed for diarrhea or loose stools.    [provider]  loratadine (CLARITIN) 5 MG chewable tablet Chew 5  mg by mouth daily.    [provider]  metoprolol  tartrate (LOPRESSOR ) 25 MG tablet TAKE 1 TABLET TWICE A DAY 05/27/24   Mallipeddi, Vishnu P, MD  nitroGLYCERIN  (NITROSTAT ) 0.4 MG SL tablet Place 1 tablet (0.4 mg total) under the tongue every 5 (five) minutes x 3 doses as needed for chest pain (If no relief after 3rd dose, call 911 or go to ED.). 11/05/22 02/09/89  Mallipeddi, Vishnu P, MD  Polyvinyl Alcohol -Povidone (REFRESH OP) Place 1 drop into both eyes daily as needed (dry eyes).    [provider]  psyllium (METAMUCIL) 58.6 % packet Take 1 packet by mouth 2 (two) times daily. 07/13/24 10/11/24  Cinderella Deatrice FALCON, MD  rifaximin  (XIFAXAN ) 550 MG TABS tablet Take 1 tablet (550 mg total) by mouth 3 (three) times daily. 07/20/24   Cinderella Deatrice FALCON, MD    Allergies as of 07/26/2024 - Review Complete 07/13/2024  Allergen Reaction Noted   Dicyclomine  Other (See Comments) 07/16/2015   Belladonna alkaloids  10/06/2017   Ciprocin-fluocin-procin [fluocinolone]     Myrbetriq [mirabegron]  07/07/2017   Prednisone Other (See Comments) 06/15/2015   Quinolones Other (See Comments) 06/15/2015   Vesicare [solifenacin]  06/28/2015    Family History  Problem Relation Age of Onset   Stroke Mother    Hypertension Mother    Dementia Mother    Arthritis Mother    Hypertension Father    Prostate cancer Father    Arthritis Father    Hypertension Sister    Glaucoma Sister    Diverticulosis Sister    Hypertension Brother    Colon cancer Cousin 37   Colon cancer Paternal Uncle 48    Social History   Socioeconomic History   Marital status: Married    Spouse name: Not on file   Number of children: Not on file   Years of education: Not on file   Highest education level: Not on file  Occupational History   Not on file  Tobacco Use   Smoking status: Every Day    Current packs/day: 1.00    Average packs/day: 1 pack/day for 40.0 years (40.0 ttl pk-yrs)    Types: Cigarettes    Smokeless tobacco: Never  Vaping Use   Vaping status: Never Used  Substance and Sexual Activity   Alcohol  use: Yes    Comment: occasionally   Drug use: No   Sexual activity: Not Currently  Other Topics Concern   Not on file  Social History Narrative   Not on file   Social Drivers of Health   Financial Resource Strain: Not on file  Food Insecurity: Not on file  Transportation Needs: Not on file  Physical Activity: Not on file  Stress: Not on file  Social Connections: Not on file  Intimate Partner Violence: Not on file    Review of Systems: See HPI, otherwise negative ROS  Physical Exam: Vital signs in last 24 hours:     General:   Alert,  Well-developed, well-nourished, pleasant and cooperative in NAD Head:  Normocephalic and atraumatic. Eyes:  Sclera clear, no icterus.   Conjunctiva pink. Ears:  Normal auditory acuity. Nose:  No deformity, discharge,  or lesions. Msk:  Symmetrical without gross deformities. Normal posture. Extremities:  Without clubbing or edema. Neurologic:  Alert and  oriented x4;  grossly normal neurologically. Skin:  Intact without significant lesions or rashes. Psych:  Alert and cooperative. Normal mood and affect.  Impression/Plan: Suzanne Combs is a 78 y.o. female with arthritis, GERD who presents for evaluation of chronic diarrhea and history of biopsy demonstrating possible granulomatous gastritis , elevated fecal calprotectin   Proceed with upper endoscopy and colonoscopy     The risks of the procedure including infection, bleed, or perforation as well as benefits, limitations, alternatives and imponderables have been reviewed with the patient. Questions have been answered. All parties agreeable.

## 2024-09-06 NOTE — Op Note (Signed)
 [facility] Patient Name: Suzanne Combs Procedure Date: 09/06/2024 10:20 AM MRN: [id number] Date of Birth: [birth year] Attending MD: Deatrice Dine , MD, [id number] CSN: [id number] Age: 78 Admit Type: Outpatient Procedure:                Upper GI endoscopy Indications:              Surveillance procedure; previous biopsies possible                            granulomatous gastritis Providers:                Deatrice Dine, MD, Leandrew Edelman RN, RN, Bascom Blush Referring MD:              Medicines:                Monitored Anesthesia Care Complications:            No immediate complications. Estimated Blood Loss:     Estimated blood loss: none. Procedure:                Pre-Anesthesia Assessment:                           - Prior to the procedure, a History and Physical                            was performed, and patient medications and                            allergies were reviewed. The patient's tolerance of                            previous anesthesia was also reviewed. The risks                            and benefits of the procedure and the sedation                            options and risks were discussed with the patient.                            All questions were answered, and informed consent                            was obtained. Prior Anticoagulants: The patient has                            taken no anticoagulant or antiplatelet agents                            except for NSAID medication. ASA Grade Assessment:                            II - A patient with mild systemic disease. After  reviewing the risks and benefits, the patient was                            deemed in satisfactory condition to undergo the                            procedure.                           After obtaining informed consent, the endoscope was                            passed under direct vision. Throughout the                             procedure, the patient's blood pressure, pulse, and                            oxygen saturations were monitored continuously. The                            [id number] ([id number]) Upper was introduced through                            the mouth, and advanced to the second part of                            duodenum. The upper GI endoscopy was accomplished                            without difficulty. The patient tolerated the                            procedure well. Scope In: 10:32:42 AM Scope Out: 10:38:31 AM Total Procedure Duration: 0 hours 5 minutes 49 seconds  Findings:      The examined esophagus was normal.      A 2 cm hiatal hernia was present.      Patchy moderate inflammation characterized by adherent blood was found       in the gastric antrum. Biopsies were taken with a cold forceps for       histology.      The duodenal bulb and second portion of the duodenum were normal.       Biopsies were taken with a cold forceps for histology. Impression:               - Normal esophagus.                           - 2 cm hiatal hernia.                           - Gastritis. Biopsied. (sydney protocol)                           - Normal duodenal bulb and second portion of the  duodenum. Biopsied. Moderate Sedation:      Per Anesthesia Care Recommendation:           - Patient has a contact number available for                            emergencies. The signs and symptoms of potential                            delayed complications were discussed with the                            patient. Return to normal activities tomorrow.                            Written discharge instructions were provided to the                            patient.                           - Resume previous diet.                           - Continue present medications.                           - Await pathology results.                           - Repeat upper endoscopy for  surveillance based on                            pathology results.                           - Return to GI office as previously scheduled. Procedure Code(s):        --- Professional ---                           [phone], Esophagogastroduodenoscopy, flexible,                            transoral; with biopsy, single or multiple Diagnosis Code(s):        --- Professional ---                           K44.9, Diaphragmatic hernia without obstruction or                            gangrene                           K29.70, Gastritis, unspecified, without bleeding CPT copyright 2022 American Medical Association. All rights reserved. The codes documented in this report are preliminary and upon coder review may  be revised to meet current compliance requirements. Deatrice Dine, MD Deatrice Dine, MD 09/06/2024 11:08:42 AM This report has  been signed electronically. Number of Addenda: 0

## 2024-09-07 ENCOUNTER — Encounter (HOSPITAL_COMMUNITY): Payer: Self-pay | Admitting: Gastroenterology

## 2024-09-07 ENCOUNTER — Ambulatory Visit (INDEPENDENT_AMBULATORY_CARE_PROVIDER_SITE_OTHER): Payer: Self-pay | Admitting: Gastroenterology

## 2024-09-07 ENCOUNTER — Encounter (INDEPENDENT_AMBULATORY_CARE_PROVIDER_SITE_OTHER): Payer: Self-pay | Admitting: *Deleted

## 2024-09-08 LAB — SURGICAL PATHOLOGY

## 2024-09-08 NOTE — Anesthesia Postprocedure Evaluation (Signed)
 Anesthesia Post Note  Patient: Suzanne Combs  Procedure(s) Performed: COLONOSCOPY EGD (ESOPHAGOGASTRODUODENOSCOPY)  Patient location during evaluation: Phase II Anesthesia Type: General Level of consciousness: awake Pain management: pain level controlled Vital Signs Assessment: post-procedure vital signs reviewed and stable Respiratory status: spontaneous breathing and respiratory function stable Cardiovascular status: blood pressure returned to baseline and stable Postop Assessment: no headache and no apparent nausea or vomiting Anesthetic complications: no Comments: Late entry   No notable events documented.   Last Vitals:  Vitals:   09/06/24 1107 09/06/24 1112  BP: (!) 120/49 (!) 137/59  Pulse: 70 71  Resp:  20  Temp: 36.4 C   SpO2: 99% 99%    Last Pain:  Vitals:   09/06/24 1107  TempSrc: Oral  PainSc: 0-No pain                 [name]

## 2024-09-13 NOTE — Progress Notes (Signed)
 Patient result letter mailed procedure note and pathology result faxed to PCP

## 2024-09-14 ENCOUNTER — Telehealth (INDEPENDENT_AMBULATORY_CARE_PROVIDER_SITE_OTHER): Payer: Self-pay | Admitting: Gastroenterology

## 2024-09-14 NOTE — Telephone Encounter (Signed)
 Pt left voicemail in regards to biopsies from procedure on 09/06/24. Returned call to pt and read result letter to her. Advised pt that we have mailed out a letter and she would be getting that in the next few days. Pt verbalized understanding.

## 2024-09-27 ENCOUNTER — Encounter (INDEPENDENT_AMBULATORY_CARE_PROVIDER_SITE_OTHER): Payer: Self-pay | Admitting: Gastroenterology

## 2024-10-06 DIAGNOSIS — H43391 Other vitreous opacities, right eye: Secondary | ICD-10-CM | POA: Diagnosis not present

## 2024-10-24 ENCOUNTER — Other Ambulatory Visit (INDEPENDENT_AMBULATORY_CARE_PROVIDER_SITE_OTHER): Payer: Self-pay

## 2024-10-24 ENCOUNTER — Ambulatory Visit (INDEPENDENT_AMBULATORY_CARE_PROVIDER_SITE_OTHER): Admitting: Gastroenterology

## 2024-10-24 ENCOUNTER — Encounter (INDEPENDENT_AMBULATORY_CARE_PROVIDER_SITE_OTHER): Payer: Self-pay | Admitting: Gastroenterology

## 2024-10-24 VITALS — BP 148/80 | HR 56 | Temp 97.0°F | Ht 64.5 in | Wt 144.4 lb

## 2024-10-24 DIAGNOSIS — K580 Irritable bowel syndrome with diarrhea: Secondary | ICD-10-CM | POA: Diagnosis not present

## 2024-10-24 DIAGNOSIS — K9089 Other intestinal malabsorption: Secondary | ICD-10-CM | POA: Insufficient documentation

## 2024-10-24 DIAGNOSIS — K5289 Other specified noninfective gastroenteritis and colitis: Secondary | ICD-10-CM

## 2024-10-24 DIAGNOSIS — K529 Noninfective gastroenteritis and colitis, unspecified: Secondary | ICD-10-CM | POA: Insufficient documentation

## 2024-10-24 MED ORDER — CHOLESTYRAMINE 4 G PO PACK: 4.0000 g | PACK | Freq: Every day | ORAL | 5 refills | Status: AC

## 2024-10-24 NOTE — Patient Instructions (Signed)
 It was very nice to meet you today, as dicussed with will plan for the following :  1) Cholestyramine 2-4 hours away from other medication   2) blood work and stool sample in 3 months  3) Breath testing ( SIBO testing ) [facility]

## 2024-10-24 NOTE — Progress Notes (Signed)
 Antonia Jicha Faizan Nayvie Lips , M.D. Gastroenterology & Hepatology [facility] Gastroenterology [address] [place], [place] [address] Primary Care Physician: [name], MD [address] [place] [address]  Chief Complaint: Chronic diarrhea   History of Present Illness:  Suzanne Combs is a 78 y.o. female with arthritis, GERD who presents for evaluation of chronic diarrhea .  Patient was last seen 08/2024 where she underwent bidirectional endoscopy.  Mapping biopsies did not show any evidence of granulomatous gastritis.  Fecal calprotectin was elevated and hence colonoscopy was performed with colonic biopsies showing focal active colitis without any chronicity.  Patient was taking Motrin at that time intermittently  Patient reports after taking 2-week course of rifaximin  her diarrhea improved a couple days after stopping the antibiotics diarrhea came back with vengeance , having 4-5 bowel movements daily.  Although is better and having 20 bowel movements as previously reported  Patient was previoulsy seen  by Dr. Golda  for diarrhea where she was given prescription of rifaximin  which was denied by insurance.  At that time she took amoxicillin which did not improve her diarrhea which suggests that she might have SIBO in addition to IBS.  She was given metronidazole  for 2 weeks with intermittent improvement   Last labs from 06/2024 hemoglobin 12.9 platelet 469 liver enzymes normal vitamin D 50 TSH 0.8 Last EGD/colonoscopy :08/2024  - Normal esophagus. - 2 cm hiatal hernia. - Gastritis. Biopsied. ( [place] protocol)  Colonoscopy 2025  - The examined portion of the ileum was normal. - Diverticulosis in the left colon. - Non- bleeding internal hemorrhoids.  A. SMALL BOWEL BIOPSY: Duodenal mucosa with preserved villoglandular architecture without increased intraepithelial lymphocytes or evidence of active inflammation. No evidence of gluten sensitive  enteropathy.  B. ANTRUM BIOPSY:      Gastric antral mucosa with chronic inactive gastritis. No H. pylori identified on HE stain. Immunohistochemical stain for H. pylori will be reported in an addendum.  C. STOMACHY BODY BIOPSY:      Gastric oxyntic mucosa with no significant diagnostic alteration. No H. pylori identified on HE stain. Negative for intestinal metaplasia or dysplasia.  D. COLON RANDOM BIOPSY:      Colonic mucosa with focal active colitis      No evidence of lymphocytic colitis or collagenous colitis.              Negative for chronicity, granuloma, dysplasia or malignancy.      See comment.  COMMENT:  D. The biopsy is composed of fragments of colonic mucosa with cryptitis and crypt abscesses. There is no evidence of chronicity including crypt architectural distortion, basilar lymphoplasmacytosis or metaplasia. The etiology considerations include infection, medication effect, diverticular disease associated colitis, self-limiting colitis, et al. The possibility of inflammatory bowel disease is relatively low. Clinical correlation is recommended.     2021 - Normal esophagus. - Z- line irregular, 38 cm from the incisors. - 2 cm hiatal hernia. - Gastritis. - Petechiah hemorrhage in the gastric antrum. Biopsied. - Normal duodenal bulb and second portion of the duodenum. Biopsied.    Colonoscopy:2021 - Perianal skin tags found on perianal exam. - Diverticulosis in the sigmoid colon. - External hemorrhoids. - Biopsies were taken with a cold forceps from the ascending colon and sigmoid colon for evaluation of microscopic colitis.   Gastric antral mucosa with mild chronic gastritis, microgranulomata and reactive changes. - See comment.  B. DUODENUM, BIOPSY: - Duodenal mucosa with no significant pathologic findings. - Negative for  increased intraepithelial lymphocytes and villous architectural changes.  C. COLON, ASCENDING AND SIGMOID, BIOPSY: - Colonic mucosa with  no significant pathologic findings. - Negative for active inflammation and other abnormalities.  COMMENT:  A. The microgranulomata are non-necrotizing.  The differential diagnosis includes idiopathic, foreign body, infection, sarcoidosis and Crohn's disease, among others.  Polarized light examination is negative. Special stains AFB (acid fast bacillus) and GMS (fungus) are noncontributory as the foci of interest cut away on further processing. Special Warthin-Starry stain is negative for organisms of Helicobacter pylori.   Dr Golda note: Gastric biopsy shows nonnecrotizing granulomata.  Endoscopically appearances not unusual.  Significance of this finding is not clear.  Will check angiotensin-converting enzyme.  Doubt that has anything to do with her diarrhea.   Duodenal biopsy negative for celiac disease and colonic biopsy negative for micro scopic colitis.  Will treat patient with Xifaxan  550 mg 3 times a day for 2 weeks  Patient advised to keep stool diary and call office with progress report in day 13.  If she has partial improvement will consider treating her for another 2 weeks.   FHx: neg for any gastrointestinal/liver disease, no malignancies Social: neg smoking, alcohol  or illicit drug use Surgical: Cholecystectomy  Past Medical History: Past Medical History:  Diagnosis Date   Arthritis    back, neck, and fingers   Chronic kidney disease    Left ureter obstruction   Complication of anesthesia    GERD (gastroesophageal reflux disease)    History of hiatal hernia    Neuromuscular disorder (HCC)    bulging disc in neck   PONV (postoperative nausea and vomiting)     Past Surgical History: Past Surgical History:  Procedure Laterality Date   BIOPSY  03/22/2020   Procedure: BIOPSY;  Surgeon: Golda Claudis PENNER, MD;  [facility];  Service: Endoscopy;;   CHOLECYSTECTOMY     COLONOSCOPY N/A 06/28/2015   Procedure: COLONOSCOPY;  Surgeon: Claudis PENNER Golda, MD;   [facility]: [facility];  Service: Endoscopy;  Laterality: N/A;  1100   COLONOSCOPY N/A 03/22/2020   Procedure: COLONOSCOPY;  Surgeon: Golda Claudis PENNER, MD;  [facility];  Service: Endoscopy;  Laterality: N/A;   COLONOSCOPY N/A 09/06/2024   Procedure: COLONOSCOPY;  Surgeon: Cinderella Deatrice FALCON, MD;  [facility];  Service: Endoscopy;  Laterality: N/A;  10;15 am, asa 1-2   ESOPHAGOGASTRODUODENOSCOPY N/A 03/22/2020   Procedure: ESOPHAGOGASTRODUODENOSCOPY (EGD);  Surgeon: Golda Claudis PENNER, MD;  [facility];  Service: Endoscopy;  Laterality: N/A;  830   ESOPHAGOGASTRODUODENOSCOPY N/A 09/06/2024   Procedure: EGD (ESOPHAGOGASTRODUODENOSCOPY);  Surgeon: Cinderella Deatrice FALCON, MD;  [facility]: [facility];  Service: Endoscopy;  Laterality: N/A;   EYE SURGERY Bilateral 2002   cataract   TONSILLECTOMY      Family History: Family History  Problem Relation Age of Onset   Stroke Mother    Hypertension Mother    Dementia Mother    Arthritis Mother    Hypertension Father    Prostate cancer Father    Arthritis Father    Hypertension Sister    Glaucoma Sister    Diverticulosis Sister    Hypertension Brother    Colon cancer Cousin 37   Colon cancer Paternal Uncle 41    Social History: Social History   Tobacco Use  Smoking Status Every Day   Current packs/day: 1.00   Average packs/day: 1 pack/day for 40.0 years (40.0 ttl pk-yrs)   Types: Cigarettes  Smokeless Tobacco  Never   Social History   Substance and Sexual Activity  Alcohol  Use Never   Comment: occasionally   Social History   Substance and Sexual Activity  Drug Use No    Allergies: Allergies  Allergen Reactions   Dicyclomine  Other (See Comments)    Severe dry mouth   Belladonna Alkaloids     Per Patient is caused bad headaches.   Ciprocin-Fluocin-Procin [Fluocinolone]     Unknown reaction   Myrbetriq [Mirabegron]     headaches   Prednisone Other (See Comments)    Can not  tablets(Pain,headache)   Quinolones Other (See Comments)    Affects joints   Vesicare [Solifenacin]     Increased BP and heart rate    Medications: Current Outpatient Medications  Medication Sig Dispense Refill   calcium carbonate (CALCIUM 600) 600 MG TABS tablet Take 600 mg by mouth daily with breakfast.     calcium carbonate (TUMS - DOSED IN MG ELEMENTAL CALCIUM) 500 MG chewable tablet Chew 1 tablet by mouth daily as needed for indigestion or heartburn.     Cholecalciferol (DIALYVITE VITAMIN D3 MAX) 1.25 MG (50000 UT) TABS Take by mouth once a week.     ELIQUIS 5 MG TABS tablet Take 5 mg by mouth 2 (two) times daily.     famotidine (PEPCID AC MAXIMUM STRENGTH) 20 MG tablet Take 20 mg by mouth daily at 12 noon.      loperamide  (IMODIUM  A-D) 2 MG tablet Take 2 mg by mouth as needed for diarrhea or loose stools.     loratadine (CLARITIN) 5 MG chewable tablet Chew 5 mg by mouth daily.     metoprolol  tartrate (LOPRESSOR ) 25 MG tablet TAKE 1 TABLET TWICE A DAY 180 tablet 2   nitroGLYCERIN  (NITROSTAT ) 0.4 MG SL tablet Place 1 tablet (0.4 mg total) under the tongue every 5 (five) minutes x 3 doses as needed for chest pain (If no relief after 3rd dose, call 911 or go to ED.). 90 tablet 3   Polyvinyl Alcohol -Povidone (REFRESH OP) Place 1 drop into both eyes daily as needed (dry eyes).     rifaximin  (XIFAXAN ) 550 MG TABS tablet Take 1 tablet (550 mg total) by mouth 3 (three) times daily. 42 tablet 0   No current facility-administered medications for this visit.    Review of Systems: GENERAL: negative for malaise, night sweats HEENT: No changes in hearing or vision, no nose bleeds or other nasal problems. NECK: Negative for lumps, goiter, pain and significant neck swelling RESPIRATORY: Negative for cough, wheezing CARDIOVASCULAR: Negative for chest pain, leg swelling, palpitations, orthopnea GI: SEE HPI MUSCULOSKELETAL: Negative for joint pain or swelling, back pain, and muscle pain. SKIN:  Negative for lesions, rash HEMATOLOGY Negative for prolonged bleeding, bruising easily, and swollen nodes. ENDOCRINE: Negative for cold or heat intolerance, polyuria, polydipsia and goiter. NEURO: negative for tremor, gait imbalance, syncope and seizures. The remainder of the review of systems is noncontributory.   Physical Exam: There were no vitals taken for this visit. GENERAL: The patient is AO x3, in no acute distress. HEENT: Head is normocephalic and atraumatic. EOMI are intact. Mouth is well hydrated and without lesions. NECK: Supple. No masses LUNGS: Clear to auscultation. No presence of rhonchi/wheezing/rales. Adequate chest expansion HEART: RRR, normal s1 and s2. ABDOMEN: Soft, nontender, no guarding, no peritoneal signs, and nondistended. BS +. No masses.   Imaging/Labs: as above     Latest Ref Rng & Units 01/30/2020    2:36 PM 12/26/2019  2:56 PM  CBC  WBC 3.8 - 10.8 Thousand/uL 10.0  11.1   Hemoglobin 11.7 - 15.5 g/dL 86.1  86.2   Hematocrit 35.0 - 45.0 % 40.5  39.0   Platelets 140 - 400 Thousand/uL 491  474    No results found for: IRON, TIBC, FERRITIN  I personally reviewed and interpreted the available labs, imaging and endoscopic files.   Xray:   IMPRESSION: No significant formed stool in the colon. Air throughout nondilated small and large bowel, nonspecific bowel gas pattern.    Normal liver enzymes Celiac panel negative CRP negative   Fecal caprotectin elevated at 422 (POSITIVE) , Impression and Plan:  RENIYAH GOOTEE is a 78 y.o. female with arthritis, GERD who presents for evaluation of chronic diarrhea and history of biopsy demonstrating possible granulomatous gastritis   #Chronic diarrhea #SIBO/IBS  There was concern of granulomatous gastritis possibly food related or medication effect in 2021 and recent [place] protocol biopsies negative for any granulomatous gastritis  Fecal calprotectin was elevated but endoscopy clear there  was no inflammation in the colon and terminal ileum, random colon biopsies demonstrating focal active colitis without any chronicity or architectural distortion suggesting unlikely inflammatory bowel disease  Patient did improve with rifaximin  2 weeks but diarrhea came back  I wonder if patient has SIMO/SIBO since her diarrhea improved with rifaximin  for relapsed,  which will be evaluated with breath testing  Patient diet is low in fiber and recommended Metamucil starting 1 scoop daily for 1 week followed by 2 scoops daily second week, and 3 scoops daily thereafter, to bulk up stool  Patient could have overlap between IBS-D and SIBO as she has abdominal discomfort relieved with defecation.  Previously had significant improvement with antibiotics and patient reports she takes antibiotics for any other reasons her diarrhea improves  ACE level is normal .  -will give trial of cholestyramine at this could be bile acid diarrhea as patient had history of cholecystectomy   -Will Continue assess symptoms, and repeat fecal calpro/CRP in future . Patient was on NSAIDs and this could be NSAID induced colopathy    All questions were answered.      Stanislaus Kaltenbach Faizan Jakyle Petrucelli, MD Gastroenterology and Hepatology [facility] Gastroenterology   This chart has been completed using [facility] Dictation software, and while attempts have been made to ensure accuracy , certain words and phrases may not be transcribed as intended

## 2024-12-14 ENCOUNTER — Telehealth: Payer: Self-pay | Admitting: Internal Medicine

## 2024-12-14 NOTE — Telephone Encounter (Signed)
" °*  STAT* If patient is at the pharmacy, call can be transferred to refill team.   1. Which medications need to be refilled? (please list name of each medication and dose if known) ELIQUIS 5 MG TABS tablet    2. Would you like to learn more about the convenience, safety, & potential cost savings by using the [facility]? No    3. Are you open to using the [facility] (Type [facility]. No    4. Which pharmacy/location (including street and city if local pharmacy) is medication to be sent to?[id number] - [place], [place] - [address]    5. Do they need a 30 day or 90 day supply? 30 day   Pt is out of medication. Pt is waiting on her delivery from [facility] and asking that we send a 30 day supply to her local pharmacy. Please advise.  "

## 2024-12-16 ENCOUNTER — Other Ambulatory Visit: Payer: Self-pay

## 2024-12-16 MED ORDER — ELIQUIS 5 MG PO TABS: 5.0000 mg | ORAL_TABLET | Freq: Two times a day (BID) | ORAL | 0 refills | Status: AC

## 2024-12-16 NOTE — Telephone Encounter (Signed)
 Prescription refill request for Eliquis received. Indication: A-Fib Last office visit: 02/12/24 Scr: 0.63 07/13/24 Care Everywhere  Age: 79 Weight: 65.5 KG Pt has passed Parameters. Dose Good

## 2024-12-20 NOTE — Telephone Encounter (Signed)
 This was done in a separate encounter, see phone note 12/16/2024.

## 2025-01-13 ENCOUNTER — Ambulatory Visit: Admitting: Adult Health

## 2025-01-13 ENCOUNTER — Encounter: Payer: Self-pay | Admitting: Adult Health

## 2025-01-13 VITALS — BP 138/79 | HR 58 | Ht 64.5 in | Wt 139.0 lb

## 2025-01-13 DIAGNOSIS — B379 Candidiasis, unspecified: Secondary | ICD-10-CM | POA: Diagnosis not present

## 2025-01-13 DIAGNOSIS — N898 Other specified noninflammatory disorders of vagina: Secondary | ICD-10-CM | POA: Insufficient documentation

## 2025-01-13 DIAGNOSIS — Z1332 Encounter for screening for maternal depression: Secondary | ICD-10-CM | POA: Diagnosis not present

## 2025-01-13 LAB — POCT WET PREP (WET MOUNT)
Clue Cells Wet Prep Whiff POC: NEGATIVE
Trichomonas Wet Prep HPF POC: ABSENT

## 2025-01-13 MED ORDER — NYSTATIN 100000 UNIT/GM EX CREA: 1.0000 | TOPICAL_CREAM | Freq: Two times a day (BID) | CUTANEOUS | 0 refills | Status: AC

## 2025-01-13 MED ORDER — FLUCONAZOLE 150 MG PO TABS: ORAL_TABLET | ORAL | 1 refills | Status: AC

## 2025-01-13 NOTE — Progress Notes (Signed)
 " Subjective:     Patient ID: Suzanne Combs, female   DOB: [birth year], 79 y.o.   MRN: [id number]  HPI Suzanne Combs is a 79 year old white female, married, PM in complaining of itching in vaginal area for about 2 months now, started after taking antibiotics and has used Monistat 3 times, with out relief.  PCP Is Dr Rosamond  Review of Systems +itching in vaginal area for about 3 months Stomach hurt some Denies any  vaginal bleeding  She does lose stool at times has IBS/IBD Not sexually active Reviewed past medical,surgical, social and family history. Reviewed medications and allergies.     Objective:   Physical Exam BP 138/79 (BP Location: Right Arm, Patient Position: Sitting, Cuff Size: Normal)   Pulse (!) 58   Ht 5' 4.5 (1.638 m)   Wt 139 lb (63 kg)   BMI 23.49 kg/m     Skin warm and dry.Lungs: clear to ausculation bilaterally. Cardiovascular: irregular rhythm(has A fib) Pelvic: external genitalia is normal in appearance, white discharge at clitoral hood, vagina: white scant discharge without odor,urethra has no lesions or masses noted, cervix:smooth, uterus: normal size, shape and contour, non tender, no masses felt, adnexa: no masses or tenderness noted. Bladder is non tender and no masses felt. Wet prep: + yeast and few +WBCs. AA is 1 Fall risk is low    01/13/2025   10:49 AM  Depression screen PHQ 2/9  Decreased Interest 1  Down, Depressed, Hopeless 1  PHQ - 2 Score 2  Altered sleeping 1  Tired, decreased energy 1  Change in appetite 1  Feeling bad or failure about yourself  0  Trouble concentrating 1  Moving slowly or fidgety/restless 0  Suicidal thoughts 0  PHQ-9 Score 6       01/13/2025   10:50 AM  GAD 7 : Generalized Anxiety Score  Nervous, Anxious, on Edge 0  Control/stop worrying 1  Worry too much - different things 1  Trouble relaxing 0  Restless 0  Easily annoyed or irritable 0  Afraid - awful might happen 0  Total GAD 7 Score 2    Upstream - 01/13/25  1046       Pregnancy Intention Screening   Does the patient want to become pregnant in the next year? N/A    Does the patient's partner want to become pregnant in the next year? N/A    Would the patient like to discuss contraceptive options today? N/A      Contraception Wrap Up   Current Method Abstinence;Post-Menopause    End Method Abstinence;Post-Menopause    Contraception Counseling Provided No           Examination chaperoned by Clarita Salt LPN  Assessment:     1. Itching in the vaginal area (Primary) +itching for about 2 months, started after anitbiotic, and used monistat x 3 rounds, without relief +yeast on wet prep  Will rx nystatin  cream  and diflucan   - POCT Wet Prep [employer] [place])  2. Yeast infection Rx nystatin  cream  and diflucan       Meds ordered this encounter  Medications   fluconazole  (DIFLUCAN ) 150 MG tablet    Sig: Take 1 now and 1 in 3 days    Dispense:  2 tablet    Refill:  1    Supervising Provider:   JAYNE MINDER H [[id number]]   nystatin  cream (MYCOSTATIN )    Sig: Apply 1 Application topically 2 (two) times daily.    Dispense:  30 g    Refill:  0    Supervising Provider:   JAYNE VONN DEL [[id number]]    Plan:     Follow up in 10 days for recheck     "

## 2025-01-25 ENCOUNTER — Ambulatory Visit: Admitting: Adult Health

## 2025-01-27 ENCOUNTER — Ambulatory Visit: Admitting: Adult Health

## 2025-03-15 ENCOUNTER — Ambulatory Visit: Admitting: Internal Medicine
